# Patient Record
Sex: Female | Born: 1970 | Race: White | Hispanic: No | Marital: Married | Smoking: Never smoker
Health system: Southern US, Community
[De-identification: ages and names within clinical notes are randomized; demographics above are authoritative.]

## PROBLEM LIST (undated history)

## (undated) DIAGNOSIS — R112 Nausea with vomiting, unspecified: Secondary | ICD-10-CM

## (undated) DIAGNOSIS — Z9889 Other specified postprocedural states: Secondary | ICD-10-CM

## (undated) DIAGNOSIS — M1990 Unspecified osteoarthritis, unspecified site: Secondary | ICD-10-CM

## (undated) DIAGNOSIS — R1900 Intra-abdominal and pelvic swelling, mass and lump, unspecified site: Secondary | ICD-10-CM

## (undated) DIAGNOSIS — T4145XA Adverse effect of unspecified anesthetic, initial encounter: Secondary | ICD-10-CM

## (undated) DIAGNOSIS — T8859XA Other complications of anesthesia, initial encounter: Secondary | ICD-10-CM

## (undated) DIAGNOSIS — Z8669 Personal history of other diseases of the nervous system and sense organs: Secondary | ICD-10-CM

## (undated) HISTORY — PX: FOOT NEUROMA SURGERY: SHX646

## (undated) HISTORY — PX: ABDOMINAL HYSTERECTOMY: SHX81

---

## 2000-07-31 ENCOUNTER — Other Ambulatory Visit: Admission: RE | Admit: 2000-07-31 | Discharge: 2000-07-31 | Payer: Self-pay | Admitting: Gynecology

## 2000-10-15 ENCOUNTER — Ambulatory Visit (HOSPITAL_COMMUNITY): Admission: RE | Admit: 2000-10-15 | Discharge: 2000-10-15 | Payer: Self-pay | Admitting: Gastroenterology

## 2001-01-12 ENCOUNTER — Encounter: Payer: Self-pay | Admitting: Gynecology

## 2001-01-12 ENCOUNTER — Encounter: Admission: RE | Admit: 2001-01-12 | Discharge: 2001-01-12 | Payer: Self-pay | Admitting: Gynecology

## 2001-08-26 HISTORY — PX: AUGMENTATION MAMMAPLASTY: SUR837

## 2002-10-01 ENCOUNTER — Other Ambulatory Visit: Admission: RE | Admit: 2002-10-01 | Discharge: 2002-10-01 | Payer: Self-pay | Admitting: Gynecology

## 2004-01-19 ENCOUNTER — Encounter: Admission: RE | Admit: 2004-01-19 | Discharge: 2004-01-19 | Payer: Self-pay | Admitting: Family Medicine

## 2005-05-08 ENCOUNTER — Encounter: Admission: RE | Admit: 2005-05-08 | Discharge: 2005-05-08 | Payer: Self-pay | Admitting: Family Medicine

## 2006-08-13 ENCOUNTER — Encounter: Admission: RE | Admit: 2006-08-13 | Discharge: 2006-08-13 | Payer: Self-pay | Admitting: Family Medicine

## 2008-03-23 ENCOUNTER — Encounter: Admission: RE | Admit: 2008-03-23 | Discharge: 2008-03-23 | Payer: Self-pay | Admitting: Family Medicine

## 2010-09-16 ENCOUNTER — Encounter: Payer: Self-pay | Admitting: Obstetrics and Gynecology

## 2011-01-11 NOTE — Procedures (Signed)
[place]. [facility]  Patient:    Jessica Robinson, Jessica Robinson                    MRN: [id number] Proc. Date: 10/15/00 Adm. Date:  [id number] Attending:  Charna Elizabeth CC:         Rande Brunt. Eda Paschal, M.D.  Tina L. Gatewood, P.A.   Procedure Report  DATE OF BIRTH:  01/30/1971.  PROCEDURE:  Colonoscopy.  ENDOSCOPIST:  Anselmo Rod, M.D.  INSTRUMENT USED:  Olympus video colonoscope.  INDICATION FOR PROCEDURE:  History of endometriosis, severe constipation, blood in stool in 40 year old white female.  Rule out colonic polyps, masses, hemorrhoids, etc.  PREPROCEDURE PREPARATION:  Informed consent was procured from the patient. The patient was fasted for eight hours prior to the procedure and prepped with a bottle of magnesium citrate and a gallon of NuLytely the night prior to the procedure.  PREPROCEDURE PHYSICAL:  VITAL SIGNS:  The patient had stable vital signs.  NECK:  Supple.  CHEST:  Clear to auscultation.  S1, S2 regular.  ABDOMEN:  Soft with normal abdominal bowel sounds.  DESCRIPTION OF PROCEDURE:  The patient was placed in the left lateral decubitus position and sedated with 100 mg of Demerol and 10 mg of Versed intravenously.  Once the patient was adequately sedate and maintained on low-flow oxygen and continuous cardiac monitoring, the Olympus video colonoscope was advanced from the rectum to the cecum and terminal ileum without difficulty.  The entire colonic mucosa appeared healthy with normal vascular pattern, no erosions, ulcerations, masses, or polyps were seen.  The terminal ileum also appeared healthy.  IMPRESSION:  Normal-appearing colon and terminal ileum, small internal hemorrhoids seen on retroflexion.  RECOMMENDATIONS: 1. The patient has been advised to increase the fluid and fiber in her diet. 2. Outpatient follow-up is advised in the next four weeks. DD:  10/15/00 TD:  10/15/00 Job: [id number]

## 2011-06-27 ENCOUNTER — Ambulatory Visit (HOSPITAL_BASED_OUTPATIENT_CLINIC_OR_DEPARTMENT_OTHER)
Admission: RE | Admit: 2011-06-27 | Discharge: 2011-06-28 | Disposition: A | Discharge status: Home / Self Care | Payer: PRIVATE HEALTH INSURANCE | Source: Ambulatory Visit | Attending: Orthopedic Surgery | Admitting: Orthopedic Surgery

## 2011-06-27 DIAGNOSIS — Z5333 Arthroscopic surgical procedure converted to open procedure: Secondary | ICD-10-CM | POA: Insufficient documentation

## 2011-06-27 DIAGNOSIS — M24119 Other articular cartilage disorders, unspecified shoulder: Secondary | ICD-10-CM | POA: Insufficient documentation

## 2011-06-27 DIAGNOSIS — X58XXXA Exposure to other specified factors, initial encounter: Secondary | ICD-10-CM | POA: Insufficient documentation

## 2011-06-27 DIAGNOSIS — S43429A Sprain of unspecified rotator cuff capsule, initial encounter: Secondary | ICD-10-CM | POA: Insufficient documentation

## 2011-06-27 DIAGNOSIS — S43439A Superior glenoid labrum lesion of unspecified shoulder, initial encounter: Secondary | ICD-10-CM | POA: Insufficient documentation

## 2011-06-27 HISTORY — PX: BICEPS TENODESIS: SHX895

## 2011-06-27 HISTORY — PX: SHOULDER ARTHROSCOPY W/ ROTATOR CUFF REPAIR: SHX2400

## 2011-06-29 NOTE — Op Note (Signed)
[name], [name] NO.:  [id number]  MEDICAL RECORD NO.:  [id number]  LOCATION:                                 FACILITY:  PHYSICIAN:  Katy Fitch. Matthieu Loftus, M.D. DATE OF BIRTH:  [birth year]  DATE OF PROCEDURE:  06/27/2011 DATE OF DISCHARGE:                              OPERATIVE REPORT   PREOPERATIVE DIAGNOSES: 1. Type 1 superior labral anterior-posterior lesion MRI documented,     right shoulder. 2. Acromioclavicular arthropathy. 3. Rule out partial-thickness rotator cuff tear.  POSTOPERATIVE DIAGNOSES:  Mild glenohumeral instability with type 1/3 superior labral anterior-posterior lesion with instability of anterior labral origin of anterior-superior glenohumeral ligament and frankly unstable biceps origin and minimal supraspinatus rotator cuff articular surface tendinopathy/degenerative tear.  OPERATION: 1. Examination of right shoulder under anesthesia documenting mild     anterior instability. 2. Diagnostic arthroscopy confirming a type 3 superior labral anterior-     posterior lesion with a degenerative tear of the labrum from 1     o'clock to approximately 3:30 anteriorly with instability at the     anterior superior glenohumeral ligament and labrum. 3. Arthroscopic subacromial decompression with acromioplasty,     coracoacromial ligament release, bursectomy. 4. Arthroscopic distal clavicle resection. 5. Open and arthroscopic subpectoral biceps tenodesis utilizing a 5.5     mm Bio-Corkscrew placed through cortical bone, followed by     arthroscopic resection of biceps stump and debridement of minimal     supraspinatus rotator cuff tear.  OPERATING SURGEON:  Katy Fitch. Emilie Carp, MD  ASSISTANT:  Marveen Reeks Dasnoit, PA-C  ANESTHESIA:  General endotracheal supplemented by a right plexus block.  SUPERVISING ANESTHESIOLOGIST:  Janetta Hora. 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  She presented for evaluation of a painful right shoulder in August 2012. She shared the fact that she had been extremely active throughout her teenage and young adult life; working in a barn with horses for many years, performing heavy lifting; working as an Education officer, community; and now working as a Mining engineer traveling extensively and frequently lifting heavy luggage overhead.  She developed a chronic pain in the right shoulder anterior, superiorly, and posteriorly.  Her pain is aggravated by lifting.  She had night pain.  Her clinical examination suggested internal derangement of the glenohumeral joint and probable impingement and a possible subscapularis rotator cuff tear. She was sent for an MRI of her shoulder at [facility] on May 01, 2011.  Her MRI was interpreted by Dr. Vear Clock, attending radiologist, revealed minimal supraspinatus tendinosis, no MRI findings indicative of a rotator cuff tear, AC arthrosis, which impinged on the subacromial outlet, marrow and juxta-articular soft tissue edema suggesting acute arthropathy of the [facility] joint, and a SLAP lesion.  There was no medial subluxation of the long head of the biceps and no evidence of a subscapularis tear on the MRI.  We did a detailed informed consent with Ms. Base and recommended that when she was done with her summer golf and sports season, it would be appropriate to scope her shoulder and make appropriate repairs.  We told her to anticipate subacromial decompression, possible distal clavicle resection, possible rotator cuff debridement and/or partial repair, and repair of other structures found at the time of surgery.  Given her active lifestyle and the presence of a SLAP, we advised her that we might perform stabilization of her labrum if we found a tear.  Preoperatively, she was advised of the potential risks and benefits of surgery.  The  anticipated benefits would be improvement in her chronic pain, improvement in her lifting capacity, improvement in her sports capacity, and relief of her night discomfort.  Potential complications would include anesthetic complications, neurovascular injury, failure to relieve all her pain, possible stiffness of the shoulder following surgery, possible development of adhesive capsulitis that would require extended therapy, and identification of pathology that we would not be completely able to correct.  Questions were invited and answered in detail.  She elected to proceed with surgery at this time.  PROCEDURE:  Dola Lunsford was brought to room 1 of the [facility] and placed in a supine position upon the operating table. Following a detailed informed consent by Dr. Gelene Mink regarding anesthesia choices, risks and benefits, an ultrasound-guided plexus block was placed without complication.  This led to near-complete anesthesia of the right upper extremity.  In room 1, under Dr. Thornton Dales direct supervision, general endotracheal anesthesia was induced followed by careful positioning in the beach-chair position with the aid of a torso and head holder designed for shoulder arthroscopy.  Examination of the right shoulder under anesthesia in a seated position revealed at 90 degrees of elevation with anterior-posterior stress, some anterior subluxation of the humeral head off of the glenoid.  In external rotation, this was slightly enhanced.  She had no posterior instability or inferior instability and no signs of adhesive capsulitis. The right upper extremity and forequarter were prepped with DuraPrep and draped with impervious arthroscopy drapes.  The procedure commenced with placement of our arthroscope through a standard posterior viewing portal with anterior switching stick technique.  Diagnostic arthroscopy immediately confirmed the presence of a type 3 SLAP lesion with  the biceps displaced laterally and impinging within the joint, an unstable labrum extending anteriorly to approximately 3 o'clock, a peel back of more than 1 cm with frank instability of the biceps origin, and a posterior labral tear extending to approximately 10 o'clock posteriorly.  After photographic documentation of pathology, the remainder of the joint was carefully inspected.  The supraspinatus tendon had a 15% partial-thickness articular side degenerative tear just posterior to the biceps tendon.  The junction of the supraspinatus and infraspinatus tendons was normal.  The teres minor was inspected, the inferior recess inspected, and the inferior and posterior labrum inspected; all found to be normal.  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 The humeral head had excellent hyaline cartilage.  A nerve hook was used to palpate the anterior glenohumeral ligaments and the anterior superior glenohumeral ligaments was frankly unstable.  This was not a Beaufort complex.  The middle and inferior glenohumeral ligaments had a stable origin and the labrum was stable from 4 o'clock inferiorly.  At this point, at age 61 with her heavy lifting demands, we elected to proceed with biceps tenodesis and repair of her labrum and anterior- superior glenohumeral ligament origin.  A second anterior portal was created as a working portal with a clear cannula.  With the aid of a suture lasso, we placed a FiberLoop suture independently through the labrum and anterior-superior glenohumeral ligament, completed  the loop, and subsequently obtained a position at approximately 2 o'clock on the glenoid where we tensioned the anterior- superior glenohumeral ligament and placed a 3.5 mm PushLock with standard technique just at the glenoid rim.  Photographic documentation of the PushLock placement was accomplished with a digital camera and significant tension on the suture revealed that the  PushLock appeared to be stable.  The unstable superior portion of the labrum was then resected arthroscopically with scissors and a suction shaver.  A needle was used to locate the biceps arthroscopically and we considered arthroscopic tenodesis of the biceps.  However, given her lifting demands, etc., I decided to perform traditional open technique through a small muscle-splitting incision.  After locating the biceps and the pectoralis major insertion, a 2.5 cm muscle-splitting incision was fashioned anteriorly, digitally dissected through the deltoid to the bicipital groove, and an Arthrex retractor was placed for visualization.  We identified the biceps tendon, placed an Allis clamp, retracted the tendon superior to the pectoralis major, placed a 6 pass grasping suture in the tendon and subsequently placed at the level of the pectoralis major a 5.5 mm Arthrex BioComposite anchor tensioning the biceps tendon appropriately.  After this was completed and knots buried beneath the pectoralis major tendon, the proximal portion of the tendon was transected with tenotomy scissors and allowed to retract towards the joint.  We subsequently performed closure of this wound with 0-Vicryl subcutaneously and 2-0 Vicryl and intradermal 3-0 Prolene.  The scope was then replaced in the glenohumeral joint followed by use of a Kingfisher to retract the tendon into the working portal through a cannula, followed by use of arthroscopic scissors through the superior portal to transect the biceps.  An arthroscopic shaver was then used to smooth the margins of the resected labrum.  Photographic documentation of the final resection was accomplished with a digital camera.  We then performed a complete bursectomy in the subacromial space, release of coracoacromial ligament, leveled the acromion to a type 1 morphology, and resected the distal centimeter of clavicle.  After hemostasis was achieved, photographic  documentation of decompression was accomplished followed by removal of the arthroscopic equipment.  The portals were repaired with intradermal 3-0 Prolene.  Ms. Nier was placed in a sling, awakened from general anesthesia, and transferred to the recovery room with stable vital signs.  She will be admitted to recovery care center for observation of vital signs and appropriate management of her pain with p.o. and IV Dilaudid as well as oral nonsteroidal medication.  She will be provided Ancef 1 g IV q.8 hours x3 doses as a prophylactic antibiotic.  There were no apparent complications.     Katy Fitch Regina Coppolino, M.D.     RVS/MEDQ  D:  06/27/2011  T:  06/28/2011  Job:  [id number]  cc:   Tarri Fuller, MD  Electronically Signed by Josephine Igo M.D. on 06/29/2011 07:41:10 AM

## 2011-10-29 ENCOUNTER — Other Ambulatory Visit: Payer: Self-pay | Admitting: Family Medicine

## 2011-10-29 DIAGNOSIS — N644 Mastodynia: Secondary | ICD-10-CM

## 2011-11-05 ENCOUNTER — Ambulatory Visit
Admission: RE | Admit: 2011-11-05 | Discharge: 2011-11-05 | Disposition: A | Discharge status: Home / Self Care | Payer: PRIVATE HEALTH INSURANCE | Source: Ambulatory Visit | Attending: Family Medicine | Admitting: Family Medicine

## 2011-11-05 ENCOUNTER — Other Ambulatory Visit: Payer: Self-pay | Admitting: Family Medicine

## 2011-11-05 DIAGNOSIS — N644 Mastodynia: Secondary | ICD-10-CM

## 2011-11-05 DIAGNOSIS — N63 Unspecified lump in unspecified breast: Secondary | ICD-10-CM

## 2012-10-01 ENCOUNTER — Other Ambulatory Visit: Payer: Self-pay | Admitting: Gastroenterology

## 2012-10-01 DIAGNOSIS — R110 Nausea: Secondary | ICD-10-CM

## 2012-10-01 DIAGNOSIS — R1011 Right upper quadrant pain: Secondary | ICD-10-CM

## 2012-10-06 ENCOUNTER — Ambulatory Visit
Admission: RE | Admit: 2012-10-06 | Discharge: 2012-10-06 | Disposition: A | Discharge status: Home / Self Care | Payer: PRIVATE HEALTH INSURANCE | Source: Ambulatory Visit | Attending: Gastroenterology | Admitting: Gastroenterology

## 2012-10-06 DIAGNOSIS — R1011 Right upper quadrant pain: Secondary | ICD-10-CM

## 2012-10-06 DIAGNOSIS — R110 Nausea: Secondary | ICD-10-CM

## 2012-10-09 ENCOUNTER — Ambulatory Visit (HOSPITAL_COMMUNITY): Payer: PRIVATE HEALTH INSURANCE

## 2012-10-12 ENCOUNTER — Ambulatory Visit (HOSPITAL_COMMUNITY): Payer: PRIVATE HEALTH INSURANCE

## 2012-10-12 ENCOUNTER — Other Ambulatory Visit (HOSPITAL_COMMUNITY): Payer: PRIVATE HEALTH INSURANCE

## 2012-10-12 ENCOUNTER — Encounter (HOSPITAL_COMMUNITY): Payer: PRIVATE HEALTH INSURANCE

## 2012-10-16 ENCOUNTER — Ambulatory Visit (HOSPITAL_COMMUNITY): Payer: PRIVATE HEALTH INSURANCE

## 2012-10-16 ENCOUNTER — Encounter (HOSPITAL_COMMUNITY): Payer: PRIVATE HEALTH INSURANCE

## 2012-10-16 ENCOUNTER — Encounter (HOSPITAL_COMMUNITY)
Admission: RE | Admit: 2012-10-16 | Discharge: 2012-10-16 | Disposition: A | Discharge status: Home / Self Care | Payer: PRIVATE HEALTH INSURANCE | Source: Ambulatory Visit | Attending: Gastroenterology | Admitting: Gastroenterology

## 2012-10-16 DIAGNOSIS — R109 Unspecified abdominal pain: Secondary | ICD-10-CM | POA: Insufficient documentation

## 2012-10-16 DIAGNOSIS — R1011 Right upper quadrant pain: Secondary | ICD-10-CM

## 2012-10-16 DIAGNOSIS — K9089 Other intestinal malabsorption: Secondary | ICD-10-CM | POA: Insufficient documentation

## 2012-10-16 DIAGNOSIS — R110 Nausea: Secondary | ICD-10-CM | POA: Insufficient documentation

## 2012-10-16 MED ORDER — TECHNETIUM TC 99M MEBROFENIN IV KIT
5.0000 | PACK | Freq: Once | INTRAVENOUS | Status: AC | PRN
  Administered 2012-10-16: 5 via INTRAVENOUS

## 2012-10-16 MED ORDER — SINCALIDE 5 MCG IJ SOLR
0.0200 ug/kg | Freq: Once | INTRAMUSCULAR | Status: AC
  Administered 2012-10-16: 1.4 ug via INTRAVENOUS

## 2012-10-16 MED ORDER — SINCALIDE 5 MCG IJ SOLR
INTRAMUSCULAR | Status: AC
  Administered 2012-10-16: 1.4 ug via INTRAVENOUS
  Filled 2012-10-16: qty 5

## 2012-10-22 ENCOUNTER — Encounter (INDEPENDENT_AMBULATORY_CARE_PROVIDER_SITE_OTHER): Payer: Self-pay | Admitting: Surgery

## 2012-10-27 ENCOUNTER — Encounter (INDEPENDENT_AMBULATORY_CARE_PROVIDER_SITE_OTHER): Payer: Self-pay | Admitting: Surgery

## 2012-10-27 ENCOUNTER — Ambulatory Visit (INDEPENDENT_AMBULATORY_CARE_PROVIDER_SITE_OTHER): Payer: PRIVATE HEALTH INSURANCE | Admitting: Surgery

## 2012-10-27 VITALS — BP 118/70 | HR 72 | Temp 97.3°F | Resp 16 | Ht 63.5 in | Wt 166.0 lb

## 2012-10-27 DIAGNOSIS — K811 Chronic cholecystitis: Secondary | ICD-10-CM

## 2012-10-27 HISTORY — DX: Chronic cholecystitis: K81.1

## 2012-10-27 NOTE — Progress Notes (Signed)
Patient ID: Jessica Robinson, female   DOB: Jun 11, 1971, 42 y.o.   MRN: [id number]  Chief Complaint  Patient presents with  . New Evaluation    eval GB    HPI Jessica Robinson is a 42 y.o. female.   HPI This is a very pleasant female referred by Dr. Loreta Ave for evaluation of epigastric and right upper quadrant abdominal pain. She has had nausea with this as well. She has a history of colitis and had a severe attack  While on vacation. Since then, she has had nausea in the morning. She also has right quadrant abdominal pain.  She has not had a change in bowel movements. This appears to be worse with fatty meals. She has had an extensive workup for the discomfort. She is otherwise without complaints. Past Medical History  Diagnosis Date  . Endometriosis of ovary   . 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 Diverticular disease   . Colitis     Past Surgical History  Procedure Laterality Date  . Abdominal hysterectomy    . Foot neuroma surgery    . Rotator cuff repair    . Lumbar fusion    . Biceps tendon repair      Family History  Problem Relation Age of Onset  . Cancer Father     waldenstroms  . Cancer Sister     Raj Janus  . Cancer Paternal Aunt     breast  . Cancer Paternal Grandmother     breat    Social History History  Substance Use Topics  . Smoking status: Never Smoker   . Smokeless tobacco: Never Used  . Alcohol Use: Yes     Comment: occ    Allergies  Allergen Reactions  . Macrolides And Ketolides   . Phenergan (Promethazine Hcl)     Current Outpatient Prescriptions  Medication Sig Dispense Refill  . Estrogens Conjugated (PREMARIN PO) Take by mouth.       No current facility-administered medications for this visit.    Review of Systems Review of Systems  Constitutional: Positive for appetite change. Negative for fever, chills and unexpected weight change.  HENT: Negative for hearing loss, congestion, sore throat, trouble swallowing and voice change.   Eyes:  Negative for visual disturbance.  Respiratory: Negative for cough and wheezing.   Cardiovascular: Negative for chest pain, palpitations and leg swelling.  Gastrointestinal: Positive for nausea and abdominal pain. Negative for vomiting, diarrhea, constipation, blood in stool, abdominal distention and anal bleeding.  Genitourinary: Negative for hematuria, vaginal bleeding and difficulty urinating.  Musculoskeletal: Negative for arthralgias.  Skin: Negative for rash and wound.  Neurological: Negative for seizures, syncope and headaches.  Hematological: Negative for adenopathy. Does not bruise/bleed easily.  Psychiatric/Behavioral: Negative for confusion.    Blood pressure 118/70, pulse 72, temperature 97.3 F (36.3 C), temperature source Temporal, resp. rate 16, height 5' 3.5" (1.613 m), weight 166 lb (75.297 kg).  Physical Exam Physical Exam  Constitutional: She is oriented to person, place, and time. She appears well-developed and well-nourished. No distress.  HENT:  Head: Normocephalic and atraumatic.  Right Ear: External ear normal.  Left Ear: External ear normal.  Nose: Nose normal.  Mouth/Throat: Oropharynx is clear and moist. No oropharyngeal exudate.  Eyes: Conjunctivae are normal. Pupils are equal, round, and reactive to light. Right eye exhibits no discharge. Left eye exhibits no discharge. No scleral icterus.  Neck: Normal range of motion. Neck supple. No tracheal deviation present. No thyromegaly present.  Cardiovascular: Normal rate,  regular rhythm, normal heart sounds and intact distal pulses.   No murmur heard. Pulmonary/Chest: Effort normal and breath sounds normal. No respiratory distress. She has no wheezes. She has no rales.  Abdominal: Soft. Bowel sounds are normal. She exhibits no distension. There is tenderness. There is no rebound.  There is very mild tenderness with mild guarding in the right upper quadrant  Musculoskeletal: Normal range of motion. She exhibits no  edema and no tenderness.  Lymphadenopathy:    She has no cervical adenopathy.  Neurological: She is alert and oriented to person, place, and time. No cranial nerve deficit. Coordination normal.  Skin: Skin is warm and dry. No rash noted. She is not diaphoretic. No erythema.  Psychiatric: Her behavior is normal. Judgment normal.    Data Reviewed I have reviewed her x-ray data including her ultrasound and high scan as well as her endoscopy report.  Assessment    Biliary colic and suspected chronic cholecystitis     Plan    Given the fact that she had symptoms with CCK administration and has a history of colitis as well as physical examination, I do believe she may have chronic cholecystitis. I discussed expected management versus going ahead and proceeding with a laparoscopic cholecystectomy. I gave her literature regarding surgery. I discussed the risks which includes but is not limited to bleeding, infection, bile duct injury, bile leak, injury to other structures, the need to convert to an open procedure, and the chance this may not resolve her symptoms. She understands and wishes to proceed. Surgery will be scheduled. Likelihood of success is moderate        BLACKMAN,DOUGLAS A 10/27/2012, 12:12 PM

## 2012-11-10 ENCOUNTER — Encounter (HOSPITAL_COMMUNITY): Payer: Self-pay

## 2012-11-11 ENCOUNTER — Encounter (HOSPITAL_COMMUNITY): Payer: Self-pay

## 2012-11-11 ENCOUNTER — Encounter (HOSPITAL_COMMUNITY)
Admission: RE | Admit: 2012-11-11 | Discharge: 2012-11-11 | Disposition: A | Discharge status: Home / Self Care | Payer: PRIVATE HEALTH INSURANCE | Source: Ambulatory Visit | Attending: Surgery | Admitting: Surgery

## 2012-11-11 HISTORY — DX: Other complications of anesthesia, initial encounter: T88.59XA

## 2012-11-11 HISTORY — DX: Adverse effect of unspecified anesthetic, initial encounter: T41.45XA

## 2012-11-11 LAB — CBC
Hemoglobin: 14.5 g/dL (ref 12.0–15.0)
MCH: 29.8 pg (ref 26.0–34.0)
MCV: 86.4 fL (ref 78.0–100.0)
RBC: 4.86 MIL/uL (ref 3.87–5.11)

## 2012-11-11 NOTE — Pre-Procedure Instructions (Signed)
Jessica Robinson  11/11/2012   Your procedure is scheduled on:  Friday, March 28th.   Report to [facility] at 5:30 AM.  Call this number if you have problems the morning of surgery: [phone]   Remember:   Do not eat food or drink liquids after midnight.   Take these medicines the morning of surgery with A SIP OF WATER: Estrogens (Premarin).  Ondanstetron (Zofran) if needed.   Do not wear jewelry, make-up or nail polish.  Do not wear lotions, powders, or perfumes. You may wear deodorant.  Do not shave 48 hours prior to surgery. Men may shave face and neck.  Do not bring valuables to the hospital.  Contacts, dentures or bridgework may not be worn into surgery.  Leave suitcase in the car. After surgery it may be brought to your room.  For patients admitted to the hospital, checkout time is 11:00 AM the day of  discharge.   Patients discharged the day of surgery will not be allowed to drive  home.  Name and phone number of your driver: -   Special Instructions: Shower using CHG 2 nights before surgery and the night before surgery.  If you shower the day of surgery use CHG.  Use special wash - you have one bottle of CHG for all showers.  You should use approximately 1/3 of the bottle for each shower.   Please read over the following fact sheets that you were given: Pain Booklet, Coughing and Deep Breathing and Surgical Site Infection Prevention

## 2012-11-17 NOTE — H&P (Signed)
Patient ID: Jessica Robinson, female DOB: [birth year], 42 y.o. MRN: [id number]  Chief Complaint   Patient presents with   .  New Evaluation     eval GB   HPI  Jessica Robinson is a 42 y.o. female.  HPI  This is a very pleasant female referred by Dr. Loreta Ave for evaluation of epigastric and right upper quadrant abdominal pain. She has had nausea with this as well. She has a history of colitis and had a severe attack While on vacation. Since then, she has had nausea in the morning. She also has right quadrant abdominal pain. She has not had a change in bowel movements. This appears to be worse with fatty meals. She has had an extensive workup for the discomfort. She is otherwise without complaints.  Past Medical History   Diagnosis  Date   .  Endometriosis of ovary    .  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  Diverticular disease    .  Colitis     Past Surgical History   Procedure  Laterality  Date   .  Abdominal hysterectomy     .  Foot neuroma surgery     .  Rotator cuff repair     .  Lumbar fusion     .  Biceps tendon repair      Family History   Problem  Relation  Age of Onset   .  Cancer  Father      waldenstroms   .  Cancer  Sister      Raj Janus   .  Cancer  Paternal Aunt      breast   .  Cancer  Paternal Grandmother      breat   Social History  History   Substance Use Topics   .  Smoking status:  Never Smoker   .  Smokeless tobacco:  Never Used   .  Alcohol Use:  Yes      Comment: occ    Allergies   Allergen  Reactions   .  Macrolides And Ketolides    .  Phenergan (Promethazine Hcl)     Current Outpatient Prescriptions   Medication  Sig  Dispense  Refill   .  Estrogens Conjugated (PREMARIN PO)  Take by mouth.      No current facility-administered medications for this visit.   Review of Systems  Review of Systems  Constitutional: Positive for appetite change. Negative for fever, chills and unexpected weight change.  HENT: Negative for hearing loss, congestion, sore  throat, trouble swallowing and voice change.  Eyes: Negative for visual disturbance.  Respiratory: Negative for cough and wheezing.  Cardiovascular: Negative for chest pain, palpitations and leg swelling.  Gastrointestinal: Positive for nausea and abdominal pain. Negative for vomiting, diarrhea, constipation, blood in stool, abdominal distention and anal bleeding.  Genitourinary: Negative for hematuria, vaginal bleeding and difficulty urinating.  Musculoskeletal: Negative for arthralgias.  Skin: Negative for rash and wound.  Neurological: Negative for seizures, syncope and headaches.  Hematological: Negative for adenopathy. Does not bruise/bleed easily.  Psychiatric/Behavioral: Negative for confusion.  Blood pressure 118/70, pulse 72, temperature 97.3 F (36.3 C), temperature source Temporal, resp. rate 16, height 5' 3.5" (1.613 m), weight 166 lb (75.297 kg).  Physical Exam  Physical Exam  Constitutional: She is oriented to person, place, and time. She appears well-developed and well-nourished. No distress.  HENT:  Head: Normocephalic and atraumatic.  Right Ear: External ear normal.  Left Ear: External ear  normal.  Nose: Nose normal.  Mouth/Throat: Oropharynx is clear and moist. No oropharyngeal exudate.  Eyes: Conjunctivae are normal. Pupils are equal, round, and reactive to light. Right eye exhibits no discharge. Left eye exhibits no discharge. No scleral icterus.  Neck: Normal range of motion. Neck supple. No tracheal deviation present. No thyromegaly present.  Cardiovascular: Normal rate, regular rhythm, normal heart sounds and intact distal pulses.  No murmur heard.  Pulmonary/Chest: Effort normal and breath sounds normal. No respiratory distress. She has no wheezes. She has no rales.  Abdominal: Soft. Bowel sounds are normal. She exhibits no distension. There is tenderness. There is no rebound.  There is very mild tenderness with mild guarding in the right upper quadrant   Musculoskeletal: Normal range of motion. She exhibits no edema and no tenderness.  Lymphadenopathy:  She has no cervical adenopathy.  Neurological: She is alert and oriented to person, place, and time. No cranial nerve deficit. Coordination normal.  Skin: Skin is warm and dry. No rash noted. She is not diaphoretic. No erythema.  Psychiatric: Her behavior is normal. Judgment normal.  Data Reviewed  I have reviewed her x-ray data including her ultrasound and high scan as well as her endoscopy report.  Assessment  Biliary colic and suspected chronic cholecystitis  Plan  Given the fact that she had symptoms with CCK administration and has a history of colitis as well as physical examination, I do believe she may have chronic cholecystitis. I discussed expected management versus going ahead and proceeding with a laparoscopic cholecystectomy. I gave her literature regarding surgery. I discussed the risks which includes but is not limited to bleeding, infection, bile duct injury, bile leak, injury to other structures, the need to convert to an open procedure, and the chance this may not resolve her symptoms. She understands and wishes to proceed. Surgery will be scheduled. Likelihood of success is moderate

## 2012-11-19 MED ORDER — CEFAZOLIN SODIUM-DEXTROSE 2-3 GM-% IV SOLR
2.0000 g | INTRAVENOUS | Status: AC
  Administered 2012-11-20: 2 g via INTRAVENOUS
  Filled 2012-11-19: qty 50

## 2012-11-20 ENCOUNTER — Encounter (HOSPITAL_COMMUNITY)
Admission: RE | Disposition: A | Discharge status: Home / Self Care | Payer: Self-pay | Source: Ambulatory Visit | Attending: Surgery

## 2012-11-20 ENCOUNTER — Ambulatory Visit (HOSPITAL_COMMUNITY): Payer: PRIVATE HEALTH INSURANCE | Admitting: Certified Registered Nurse Anesthetist

## 2012-11-20 ENCOUNTER — Encounter (HOSPITAL_COMMUNITY): Payer: Self-pay | Admitting: Certified Registered Nurse Anesthetist

## 2012-11-20 ENCOUNTER — Ambulatory Visit (HOSPITAL_COMMUNITY)
Admission: RE | Admit: 2012-11-20 | Discharge: 2012-11-20 | Disposition: A | Discharge status: Home / Self Care | Payer: PRIVATE HEALTH INSURANCE | Source: Ambulatory Visit | Attending: Surgery | Admitting: Surgery

## 2012-11-20 DIAGNOSIS — Z01812 Encounter for preprocedural laboratory examination: Secondary | ICD-10-CM | POA: Insufficient documentation

## 2012-11-20 DIAGNOSIS — K811 Chronic cholecystitis: Secondary | ICD-10-CM | POA: Insufficient documentation

## 2012-11-20 DIAGNOSIS — K8010 Calculus of gallbladder with chronic cholecystitis without obstruction: Secondary | ICD-10-CM

## 2012-11-20 HISTORY — PX: CHOLECYSTECTOMY: SHX55

## 2012-11-20 SURGERY — LAPAROSCOPIC CHOLECYSTECTOMY
Anesthesia: General | Site: Abdomen | Wound class: Clean Contaminated

## 2012-11-20 MED ORDER — ONDANSETRON HCL 4 MG/2ML IJ SOLN
4.0000 mg | Freq: Four times a day (QID) | INTRAMUSCULAR | Status: DC | PRN
  Filled 2012-11-20: qty 2

## 2012-11-20 MED ORDER — HYDROMORPHONE HCL PF 1 MG/ML IJ SOLN
INTRAMUSCULAR | Status: AC
  Filled 2012-11-20: qty 1

## 2012-11-20 MED ORDER — 0.9 % SODIUM CHLORIDE (POUR BTL) OPTIME
TOPICAL | Status: DC | PRN
  Administered 2012-11-20: 1000 mL

## 2012-11-20 MED ORDER — ACETAMINOPHEN 650 MG RE SUPP
650.0000 mg | RECTAL | Status: DC | PRN
  Filled 2012-11-20: qty 1

## 2012-11-20 MED ORDER — OXYCODONE HCL 5 MG PO TABS: 5.0000 mg | ORAL_TABLET | Freq: Once | ORAL | Status: DC | PRN

## 2012-11-20 MED ORDER — HYDROMORPHONE HCL PF 1 MG/ML IJ SOLN
0.2500 mg | INTRAMUSCULAR | Status: DC | PRN
  Administered 2012-11-20 (×2): 0.25 mg via INTRAVENOUS
  Administered 2012-11-20: 0.5 mg via INTRAVENOUS
  Administered 2012-11-20: 0.25 mg via INTRAVENOUS

## 2012-11-20 MED ORDER — SODIUM CHLORIDE 0.9 % IV SOLN: 250.0000 mL | INTRAVENOUS | Status: DC | PRN

## 2012-11-20 MED ORDER — HYDROCODONE-ACETAMINOPHEN 5-325 MG PO TABS: 1.0000 | ORAL_TABLET | ORAL | Status: DC | PRN

## 2012-11-20 MED ORDER — SODIUM CHLORIDE 0.9 % IJ SOLN: 3.0000 mL | Freq: Two times a day (BID) | INTRAMUSCULAR | Status: DC

## 2012-11-20 MED ORDER — LIDOCAINE HCL 4 % MT SOLN
OROMUCOSAL | Status: DC | PRN
  Administered 2012-11-20: 4 mL via TOPICAL

## 2012-11-20 MED ORDER — MORPHINE SULFATE 4 MG/ML IJ SOLN: 4.0000 mg | INTRAMUSCULAR | Status: DC | PRN

## 2012-11-20 MED ORDER — LACTATED RINGERS IV SOLN
INTRAVENOUS | Status: DC | PRN
  Administered 2012-11-20: 07:00:00 via INTRAVENOUS

## 2012-11-20 MED ORDER — LIDOCAINE HCL (CARDIAC) 20 MG/ML IV SOLN
INTRAVENOUS | Status: DC | PRN
  Administered 2012-11-20: 30 mg via INTRAVENOUS
  Administered 2012-11-20: 70 mg via INTRAVENOUS

## 2012-11-20 MED ORDER — ACETAMINOPHEN 325 MG PO TABS
650.0000 mg | ORAL_TABLET | ORAL | Status: DC | PRN
  Filled 2012-11-20: qty 2

## 2012-11-20 MED ORDER — MIDAZOLAM HCL 5 MG/5ML IJ SOLN
INTRAMUSCULAR | Status: DC | PRN
  Administered 2012-11-20: 2 mg via INTRAVENOUS

## 2012-11-20 MED ORDER — PROPOFOL 10 MG/ML IV BOLUS
INTRAVENOUS | Status: DC | PRN
  Administered 2012-11-20: 170 mg via INTRAVENOUS

## 2012-11-20 MED ORDER — ONDANSETRON HCL 4 MG/2ML IJ SOLN
INTRAMUSCULAR | Status: DC | PRN
  Administered 2012-11-20: 4 mg via INTRAVENOUS

## 2012-11-20 MED ORDER — GLYCOPYRROLATE 0.2 MG/ML IJ SOLN
INTRAMUSCULAR | Status: DC | PRN
  Administered 2012-11-20 (×2): 0.1 mg via INTRAVENOUS
  Administered 2012-11-20: .8 mg via INTRAVENOUS

## 2012-11-20 MED ORDER — NEOSTIGMINE METHYLSULFATE 1 MG/ML IJ SOLN
INTRAMUSCULAR | Status: DC | PRN
  Administered 2012-11-20: 5 mg via INTRAVENOUS

## 2012-11-20 MED ORDER — SODIUM CHLORIDE 0.9 % IJ SOLN: 3.0000 mL | INTRAMUSCULAR | Status: DC | PRN

## 2012-11-20 MED ORDER — BUPIVACAINE-EPINEPHRINE 0.25% -1:200000 IJ SOLN
INTRAMUSCULAR | Status: DC | PRN
  Administered 2012-11-20: 20 mL

## 2012-11-20 MED ORDER — BUPIVACAINE-EPINEPHRINE 0.25% -1:200000 IJ SOLN
INTRAMUSCULAR | Status: AC
  Filled 2012-11-20: qty 1

## 2012-11-20 MED ORDER — FENTANYL CITRATE 0.05 MG/ML IJ SOLN
INTRAMUSCULAR | Status: DC | PRN
  Administered 2012-11-20 (×2): 100 ug via INTRAVENOUS
  Administered 2012-11-20: 50 ug via INTRAVENOUS

## 2012-11-20 MED ORDER — PROMETHAZINE HCL 25 MG/ML IJ SOLN: 6.2500 mg | INTRAMUSCULAR | Status: DC | PRN

## 2012-11-20 MED ORDER — BUPIVACAINE-EPINEPHRINE PF 0.25-1:200000 % IJ SOLN
INTRAMUSCULAR | Status: AC
  Filled 2012-11-20: qty 30

## 2012-11-20 MED ORDER — KETOROLAC TROMETHAMINE 30 MG/ML IJ SOLN
INTRAMUSCULAR | Status: DC | PRN
  Administered 2012-11-20: 30 mg via INTRAVENOUS

## 2012-11-20 MED ORDER — SODIUM CHLORIDE 0.9 % IR SOLN
Status: DC | PRN
  Administered 2012-11-20: 1000 mL

## 2012-11-20 MED ORDER — ONDANSETRON HCL 8 MG PO TABS: 8.0000 mg | ORAL_TABLET | Freq: Two times a day (BID) | ORAL | Status: DC | PRN

## 2012-11-20 MED ORDER — DEXAMETHASONE SODIUM PHOSPHATE 4 MG/ML IJ SOLN
INTRAMUSCULAR | Status: DC | PRN
  Administered 2012-11-20: 8 mg via INTRAVENOUS

## 2012-11-20 MED ORDER — ROCURONIUM BROMIDE 100 MG/10ML IV SOLN
INTRAVENOUS | Status: DC | PRN
  Administered 2012-11-20: 50 mg via INTRAVENOUS

## 2012-11-20 MED ORDER — OXYCODONE HCL 5 MG PO TABS: 5.0000 mg | ORAL_TABLET | ORAL | Status: DC | PRN

## 2012-11-20 MED ORDER — OXYCODONE HCL 5 MG/5ML PO SOLN: 5.0000 mg | Freq: Once | ORAL | Status: DC | PRN

## 2012-11-20 MED ORDER — MEPERIDINE HCL 25 MG/ML IJ SOLN: 6.2500 mg | INTRAMUSCULAR | Status: DC | PRN

## 2012-11-20 SURGICAL SUPPLY — 33 items
APL SKNCLS STERI-STRIP NONHPOA (GAUZE/BANDAGES/DRESSINGS) ×1
APPLIER CLIP 5 13 M/L LIGAMAX5 (MISCELLANEOUS) ×2
APR CLP MED LRG 5 ANG JAW (MISCELLANEOUS) ×1
BANDAGE ADHESIVE 1X3 (GAUZE/BANDAGES/DRESSINGS) ×5 IMPLANT
BENZOIN TINCTURE PRP APPL 2/3 (GAUZE/BANDAGES/DRESSINGS) ×2 IMPLANT
CANISTER SUCTION 2500CC (MISCELLANEOUS) ×2 IMPLANT
CHLORAPREP W/TINT 26ML (MISCELLANEOUS) ×2 IMPLANT
CLIP APPLIE 5 13 M/L LIGAMAX5 (MISCELLANEOUS) ×1 IMPLANT
CLOTH BEACON ORANGE TIMEOUT ST (SAFETY) ×2 IMPLANT
CLSR STERI-STRIP ANTIMIC 1/2X4 (GAUZE/BANDAGES/DRESSINGS) ×1 IMPLANT
COVER SURGICAL LIGHT HANDLE (MISCELLANEOUS) ×2 IMPLANT
DECANTER SPIKE VIAL GLASS SM (MISCELLANEOUS) ×2 IMPLANT
ELECT REM PT RETURN 9FT ADLT (ELECTROSURGICAL) ×2
ELECTRODE REM PT RTRN 9FT ADLT (ELECTROSURGICAL) ×1 IMPLANT
GLOVE BIOGEL PI IND STRL 7.0 (GLOVE) IMPLANT
GLOVE BIOGEL PI INDICATOR 7.0 (GLOVE) ×1
GLOVE SURG SIGNA 7.5 PF LTX (GLOVE) ×2 IMPLANT
GLOVE SURG SS PI 7.0 STRL IVOR (GLOVE) ×1 IMPLANT
GOWN PREVENTION PLUS XLARGE (GOWN DISPOSABLE) ×2 IMPLANT
GOWN STRL NON-REIN LRG LVL3 (GOWN DISPOSABLE) ×6 IMPLANT
KIT BASIN OR (CUSTOM PROCEDURE TRAY) ×2 IMPLANT
KIT ROOM TURNOVER OR (KITS) ×2 IMPLANT
NS IRRIG 1000ML POUR BTL (IV SOLUTION) ×2 IMPLANT
PAD ARMBOARD 7.5X6 YLW CONV (MISCELLANEOUS) ×4 IMPLANT
SET IRRIG TUBING LAPAROSCOPIC (IRRIGATION / IRRIGATOR) ×2 IMPLANT
SLEEVE ENDOPATH XCEL 5M (ENDOMECHANICALS) ×4 IMPLANT
SPECIMEN JAR SMALL (MISCELLANEOUS) ×2 IMPLANT
SUT MON AB 4-0 PC3 18 (SUTURE) ×2 IMPLANT
TOWEL OR 17X24 6PK STRL BLUE (TOWEL DISPOSABLE) ×2 IMPLANT
TOWEL OR 17X26 10 PK STRL BLUE (TOWEL DISPOSABLE) ×2 IMPLANT
TRAY LAPAROSCOPIC (CUSTOM PROCEDURE TRAY) ×2 IMPLANT
TROCAR XCEL BLUNT TIP 100MML (ENDOMECHANICALS) ×2 IMPLANT
TROCAR XCEL NON-BLD 5MMX100MML (ENDOMECHANICALS) ×2 IMPLANT

## 2012-11-20 NOTE — Anesthesia Procedure Notes (Signed)
Procedure Name: Intubation Date/Time: 11/20/2012 7:25 AM Performed by: Margaree Mackintosh Pre-anesthesia Checklist: Patient identified, Timeout performed, Emergency Drugs available, Suction available and Patient being monitored Patient Re-evaluated:Patient Re-evaluated prior to inductionOxygen Delivery Method: Circle system utilized Preoxygenation: Pre-oxygenation with 100% oxygen Intubation Type: IV induction Ventilation: Mask ventilation without difficulty Laryngoscope Size: Mac and 3 Grade View: Grade I Tube type: Oral Tube size: 7.0 mm Number of attempts: 1 Airway Equipment and Method: Stylet and LTA kit utilized Placement Confirmation: ETT inserted through vocal cords under direct vision,  positive ETCO2 and breath sounds checked- equal and bilateral Secured at: 20 cm Tube secured with: Tape Dental Injury: Teeth and Oropharynx as per pre-operative assessment

## 2012-11-20 NOTE — Anesthesia Preprocedure Evaluation (Addendum)
Anesthesia Evaluation  Patient identified by MRN, date of birth, ID band  Reviewed: Allergy & Precautions, H&P , NPO status , Patient's Chart, lab work & pertinent test results  Airway Mallampati: I  Neck ROM: Full    Dental  (+) Teeth Intact   Pulmonary          Cardiovascular negative cardio ROS  Rhythm:Regular Rate:Normal     Neuro/Psych  Headaches,    GI/Hepatic Neg liver ROS, GERD-  ,  Endo/Other  negative endocrine ROS  Renal/GU negative Renal ROS     Musculoskeletal negative musculoskeletal ROS (+)   Abdominal   Peds  Hematology negative hematology ROS (+)   Anesthesia Other Findings   Reproductive/Obstetrics negative OB ROS                          Anesthesia Physical Anesthesia Plan  ASA: I  Anesthesia Plan: General   Post-op Pain Management:    Induction: Intravenous  Airway Management Planned: Oral ETT  Additional Equipment:   Intra-op Plan:   Post-operative Plan: Extubation in OR  Informed Consent: I have reviewed the patients History and Physical, chart, labs and discussed the procedure including the risks, benefits and alternatives for the proposed anesthesia with the patient or authorized representative who has indicated his/her understanding and acceptance.   Dental advisory given  Plan Discussed with: CRNA and Surgeon  Anesthesia Plan Comments:         Anesthesia Quick Evaluation

## 2012-11-20 NOTE — Op Note (Signed)

## 2012-11-20 NOTE — Transfer of Care (Signed)
Immediate Anesthesia Transfer of Care Note  Patient: Jessica Robinson  Procedure(s) Performed: Procedure(s): LAPAROSCOPIC CHOLECYSTECTOMY possible IOC  (N/A)  Patient Location: PACU  Anesthesia Type:General  Level of Consciousness: awake, alert  and oriented  Airway & Oxygen Therapy: Patient Spontanous Breathing and Patient connected to nasal cannula oxygen  Post-op Assessment: Report given to PACU RN and Post -op Vital signs reviewed and stable  Post vital signs: Reviewed and stable  Complications: No apparent anesthesia complications

## 2012-11-20 NOTE — Progress Notes (Signed)
Husband called to bedside.

## 2012-11-20 NOTE — Progress Notes (Signed)
Dr Boneta Lucks at bedside and aware of Nausea.   States to keep pt in pacu a little longer.  No  further antiemetics orders received

## 2012-11-20 NOTE — Interval H&P Note (Signed)
History and Physical Interval Note: no change in H and P  11/20/2012 6:57 AM  Jessica Robinson  has presented today for surgery, with the diagnosis of GALLBLADDER DISEASE   The various methods of treatment have been discussed with the patient and family. After consideration of risks, benefits and other options for treatment, the patient has consented to  Procedure(s): LAPAROSCOPIC CHOLECYSTECTOMY possible IOC  (N/A) as a surgical intervention .  The patient's history has been reviewed, patient examined, no change in status, stable for surgery.  I have reviewed the patient's chart and labs.  Questions were answered to the patient's satisfaction.     [name]

## 2012-11-20 NOTE — Progress Notes (Signed)
Attempt made to gt pt oob to BR. She became nauseated with movement, and vomited in WC.  Pt back to bed and placed on monitors.

## 2012-11-20 NOTE — Preoperative (Signed)
Beta Blockers   Reason not to administer Beta Blockers:Not Applicable 

## 2012-11-20 NOTE — Progress Notes (Signed)
Pt states while there is a little residual nausea, she feels much better and is ready to go home. She has ambulated to BR and voided without difficulty, and is tolerating PO's .  VSS,  D/c insurctions given.

## 2012-11-20 NOTE — Anesthesia Postprocedure Evaluation (Signed)
  Anesthesia Post-op Note  Patient: Jessica Robinson  Procedure(s) Performed: Procedure(s): LAPAROSCOPIC CHOLECYSTECTOMY possible IOC  (N/A)  Patient Location: PACU  Anesthesia Type:General  Level of Consciousness: awake and oriented  Airway and Oxygen Therapy: Patient Spontanous Breathing  Post-op Pain: mild  Post-op Assessment: Post-op Vital signs reviewed and NAUSEA AND VOMITING PRESENT  Post-op Vital Signs: stable  Complications: No apparent anesthesia complications

## 2012-11-23 ENCOUNTER — Encounter (HOSPITAL_COMMUNITY): Payer: Self-pay | Admitting: Surgery

## 2012-11-23 ENCOUNTER — Telehealth (INDEPENDENT_AMBULATORY_CARE_PROVIDER_SITE_OTHER): Payer: Self-pay | Admitting: General Surgery

## 2012-11-23 NOTE — Telephone Encounter (Signed)
LMOM for patient to call back and ask for [facility] for a follow up apt with Dr Magnus Ivan

## 2012-11-25 ENCOUNTER — Encounter (INDEPENDENT_AMBULATORY_CARE_PROVIDER_SITE_OTHER): Payer: Self-pay | Admitting: Surgery

## 2012-11-25 ENCOUNTER — Other Ambulatory Visit (INDEPENDENT_AMBULATORY_CARE_PROVIDER_SITE_OTHER): Payer: Self-pay | Admitting: Surgery

## 2012-11-25 ENCOUNTER — Telehealth (INDEPENDENT_AMBULATORY_CARE_PROVIDER_SITE_OTHER): Payer: Self-pay | Admitting: *Deleted

## 2012-11-25 ENCOUNTER — Ambulatory Visit (INDEPENDENT_AMBULATORY_CARE_PROVIDER_SITE_OTHER): Payer: PRIVATE HEALTH INSURANCE | Admitting: Surgery

## 2012-11-25 ENCOUNTER — Inpatient Hospital Stay (HOSPITAL_COMMUNITY)
Admission: EM | Admit: 2012-11-25 | Discharge: 2012-11-28 | DRG: 948 | Disposition: A | Discharge status: Home / Self Care | Payer: PRIVATE HEALTH INSURANCE | Attending: Surgery | Admitting: Surgery

## 2012-11-25 ENCOUNTER — Encounter (HOSPITAL_COMMUNITY): Payer: Self-pay | Admitting: Emergency Medicine

## 2012-11-25 VITALS — BP 123/65 | HR 66 | Temp 98.4°F | Resp 12 | Ht 64.0 in | Wt 162.2 lb

## 2012-11-25 DIAGNOSIS — K219 Gastro-esophageal reflux disease without esophagitis: Secondary | ICD-10-CM | POA: Diagnosis present

## 2012-11-25 DIAGNOSIS — Z79899 Other long term (current) drug therapy: Secondary | ICD-10-CM

## 2012-11-25 DIAGNOSIS — K839 Disease of biliary tract, unspecified: Secondary | ICD-10-CM

## 2012-11-25 DIAGNOSIS — R933 Abnormal findings on diagnostic imaging of other parts of digestive tract: Secondary | ICD-10-CM

## 2012-11-25 DIAGNOSIS — R1011 Right upper quadrant pain: Secondary | ICD-10-CM

## 2012-11-25 DIAGNOSIS — G8918 Other acute postprocedural pain: Principal | ICD-10-CM

## 2012-11-25 DIAGNOSIS — G43909 Migraine, unspecified, not intractable, without status migrainosus: Secondary | ICD-10-CM | POA: Diagnosis present

## 2012-11-25 DIAGNOSIS — Z09 Encounter for follow-up examination after completed treatment for conditions other than malignant neoplasm: Secondary | ICD-10-CM

## 2012-11-25 DIAGNOSIS — M479 Spondylosis, unspecified: Secondary | ICD-10-CM | POA: Diagnosis present

## 2012-11-25 DIAGNOSIS — K5730 Diverticulosis of large intestine without perforation or abscess without bleeding: Secondary | ICD-10-CM | POA: Diagnosis present

## 2012-11-25 HISTORY — DX: Unspecified osteoarthritis, unspecified site: M19.90

## 2012-11-25 LAB — COMPREHENSIVE METABOLIC PANEL
Albumin: 3.5 g/dL (ref 3.5–5.2)
Alkaline Phosphatase: 94 U/L (ref 39–117)
BUN: 13 mg/dL (ref 6–23)
CO2: 24 mEq/L (ref 19–32)
Chloride: 101 mEq/L (ref 96–112)
Creatinine, Ser: 0.66 mg/dL (ref 0.50–1.10)
GFR calc Af Amer: >90 mL/min (ref >90)
GFR calc non Af Amer: >90 mL/min (ref >90)
Glucose, Bld: 102 mg/dL — ABNORMAL HIGH (ref 70–99)
Potassium: 3.7 mEq/L (ref 3.5–5.1)
Total Bilirubin: 0.4 mg/dL (ref 0.3–1.2)

## 2012-11-25 LAB — CBC
HCT: 41 % (ref 36.0–46.0)
Hemoglobin: 14.1 g/dL (ref 12.0–15.0)
MCHC: 34.4 g/dL (ref 30.0–36.0)
MCV: 84.9 fL (ref 78.0–100.0)
RDW: 13.8 % (ref 11.5–15.5)

## 2012-11-25 LAB — CBC WITH DIFFERENTIAL/PLATELET
Basophils Relative: 1 % (ref 0–1)
HCT: 37.9 % (ref 36.0–46.0)
Hemoglobin: 13.6 g/dL (ref 12.0–15.0)
Lymphocytes Relative: 27 % (ref 12–46)
Lymphs Abs: 2.4 10*3/uL (ref 0.7–4.0)
MCHC: 35.9 g/dL (ref 30.0–36.0)
Monocytes Absolute: 0.7 10*3/uL (ref 0.1–1.0)
Monocytes Relative: 8 % (ref 3–12)
Neutro Abs: 5 10*3/uL (ref 1.7–7.7)
Neutrophils Relative %: 55 % (ref 43–77)
RBC: 4.54 MIL/uL (ref 3.87–5.11)

## 2012-11-25 MED ORDER — ESTROGENS CONJUGATED 0.9 MG PO TABS
0.9000 mg | ORAL_TABLET | Freq: Every day | ORAL | Status: DC
  Administered 2012-11-26 – 2012-11-27 (×2): 0.9 mg via ORAL
  Filled 2012-11-25 (×3): qty 1

## 2012-11-25 MED ORDER — PANTOPRAZOLE SODIUM 40 MG IV SOLR
40.0000 mg | Freq: Every day | INTRAVENOUS | Status: DC
  Administered 2012-11-26: 40 mg via INTRAVENOUS
  Filled 2012-11-25 (×5): qty 40

## 2012-11-25 MED ORDER — KETOROLAC TROMETHAMINE 30 MG/ML IJ SOLN
30.0000 mg | Freq: Four times a day (QID) | INTRAMUSCULAR | Status: AC
  Administered 2012-11-26 (×3): 30 mg via INTRAVENOUS
  Filled 2012-11-25 (×5): qty 1

## 2012-11-25 MED ORDER — ENOXAPARIN SODIUM 40 MG/0.4ML ~~LOC~~ SOLN
40.0000 mg | SUBCUTANEOUS | Status: DC
  Administered 2012-11-26 – 2012-11-27 (×2): 40 mg via SUBCUTANEOUS
  Filled 2012-11-25 (×4): qty 0.4

## 2012-11-25 MED ORDER — HYDROMORPHONE HCL PF 1 MG/ML IJ SOLN
1.0000 mg | Freq: Once | INTRAMUSCULAR | Status: AC
  Administered 2012-11-25: 1 mg via INTRAVENOUS
  Filled 2012-11-25: qty 1

## 2012-11-25 MED ORDER — POLYETHYLENE GLYCOL 3350 17 G PO PACK
17.0000 g | PACK | Freq: Every day | ORAL | Status: DC
  Administered 2012-11-26 – 2012-11-28 (×3): 17 g via ORAL
  Filled 2012-11-25 (×4): qty 1

## 2012-11-25 MED ORDER — HYDROCODONE-ACETAMINOPHEN 5-325 MG PO TABS
1.0000 | ORAL_TABLET | ORAL | Status: DC | PRN
  Administered 2012-11-26 – 2012-11-27 (×5): 2 via ORAL
  Filled 2012-11-25 (×5): qty 2

## 2012-11-25 MED ORDER — ONDANSETRON HCL 4 MG/2ML IJ SOLN
4.0000 mg | Freq: Four times a day (QID) | INTRAMUSCULAR | Status: DC | PRN
  Administered 2012-11-26 – 2012-11-27 (×3): 4 mg via INTRAVENOUS
  Filled 2012-11-25 (×3): qty 2

## 2012-11-25 MED ORDER — TEMAZEPAM 15 MG PO CAPS
15.0000 mg | ORAL_CAPSULE | Freq: Every evening | ORAL | Status: DC | PRN
  Administered 2012-11-28: 15 mg via ORAL
  Filled 2012-11-25: qty 1

## 2012-11-25 MED ORDER — POTASSIUM CHLORIDE IN NACL 20-0.9 MEQ/L-% IV SOLN
INTRAVENOUS | Status: DC
  Administered 2012-11-26: 125 mL/h via INTRAVENOUS
  Administered 2012-11-26 – 2012-11-28 (×5): via INTRAVENOUS
  Filled 2012-11-25 (×10): qty 1000

## 2012-11-25 MED ORDER — HYDROMORPHONE HCL PF 1 MG/ML IJ SOLN
1.0000 mg | INTRAMUSCULAR | Status: DC | PRN
  Administered 2012-11-26 (×2): 1 mg via INTRAVENOUS
  Filled 2012-11-25 (×2): qty 1

## 2012-11-25 NOTE — ED Notes (Signed)
PT. REPORTS RUQ PAIN ( INCISION SITE) UNRELIEVED BY PRESCRIPTION HYDROCODONE ONSET YESTERDAY WITH NAUSEA / CHILLS AND DIZZINESS S/P CHOLECYSTECTOMY LAST Friday BY DR. Rayburn Ma.

## 2012-11-25 NOTE — Telephone Encounter (Signed)
Patient scheduled to come into urgent office this afternoon to see Magnus Ivan MD.

## 2012-11-25 NOTE — Progress Notes (Signed)
Subjective:     Patient ID: Jessica Robinson, female   DOB: 24-Oct-1970, 42 y.o.   MRN: [id number]  HPI She is here for her first postop visit. She is 5 days postop from a laparoscopic cholecystectomy. She has been having persistent sharp right upper quadrant abdominal pain her into to the back. This was for chronic cholecystitis and not gallstones. She has not had emesis. She is moving her bowels. She denies fevers.  Review of Systems     Objective:   Physical Exam On exam, her incisions are well healed. She has mild right upper quadrant abdominal tenderness. The final pathology confirmed chronic cholecystitis.    Assessment:     Patient with postoperative abdominal pain     Plan:     Given the nature of her pain, I need to get a HIDA scan to rule out a postoperative bile leak. I will also check a CBC and a CMP. I will call her back with the results.

## 2012-11-25 NOTE — Telephone Encounter (Signed)
Does she need to come to the urgent office today?

## 2012-11-25 NOTE — ED Provider Notes (Signed)
History     CSN: [id number]  Arrival date & time 11/25/12  2014   First MD Initiated Contact with Patient 11/25/12 2120      Chief Complaint  Patient presents with  . Abdominal Pain    (Consider location/radiation/quality/duration/timing/severity/associated sxs/prior treatment) HPI History provided by pt and prior chart.  Per prior chart, pt had a cholecystectomy 5 days ago for chronic cholecystitis.  Had first post-op visit w/ Dr. Magnus Ivan today and HIDA scan scheduled for tomorrow at 6:45.  Pt has had persistent, sharp RUQ pain that is gradually worsening and refractory to vicodin.  Pain is worse now than it was at her appointment today.  No associated fever, N/V, change in bowel, urinary sx or drainage from incisions.    Past Medical History  Diagnosis Date  . Endometriosis of ovary   . 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 Diverticular disease   . Colitis   . GERD (gastroesophageal reflux disease)     will gallbladder  . Complication of anesthesia     "heart rate always drops down low" (11/25/2012)  . Migraines   . Arthritis     "in my back" (11/25/2012)    Past Surgical History  Procedure Laterality Date  . Foot neuroma surgery Right ~ 2008  . Biceps tendon repair Right   . Cholecystectomy N/A 11/20/2012    Procedure: LAPAROSCOPIC CHOLECYSTECTOMY possible IOC ;  Surgeon: Shelly Rubenstein, MD;  Location: [facility] OR;  Service: General;  Laterality: N/A;  . Shoulder arthroscopy w/ rotator cuff repair Right 06/2011    "w/bicep tendon repair" (11/25/2012)  . Abdominal hysterectomy  1997  . Augmentation mammaplasty Bilateral 2003    Family History  Problem Relation Age of Onset  . Cancer Father     waldenstroms  . Cancer Sister     Raj Janus  . Cancer Paternal Aunt     breast  . Cancer Paternal Grandmother     breat    History  Substance Use Topics  . Smoking status: Never Smoker   . Smokeless tobacco: Never Used  . Alcohol Use: Yes     Comment: 11/25/2012 "couple times a  month I might have 1/2 a beer"    OB History   Grav Para Term Preterm Abortions TAB SAB Ect Mult Living                  Review of Systems  All other systems reviewed and are negative.    Allergies  Adhesive; Macrolides and ketolides; and Phenergan  Home Medications   No current outpatient prescriptions on file.  BP 105/68  Pulse 62  Temp(Src) 98 F (36.7 C) (Oral)  Resp 18  SpO2 99%  Physical Exam  Nursing note and vitals reviewed. Constitutional: She is oriented to person, place, and time. She appears well-developed and well-nourished. No distress.  Uncomfortable appearing  HENT:  Head: Normocephalic and atraumatic.  Eyes:  Normal appearance  Neck: Normal range of motion.  Cardiovascular: Normal rate and regular rhythm.   Pulmonary/Chest: Effort normal and breath sounds normal. No respiratory distress.  Abdominal: Soft. Bowel sounds are normal. She exhibits no distension and no mass. There is no rebound.  Incisions appear to be healing well and are w/out erythema and drainage.  Mild tenderness in RLQ and severe tenderness RUQ w/ guarding.    Genitourinary:  No CVA tenderness  Musculoskeletal: Normal range of motion.  Neurological: She is alert and oriented to person, place, and time.  Skin: Skin is  warm and dry. No rash noted.  Psychiatric: She has a normal mood and affect. Her behavior is normal.    ED Course  Procedures (including critical care time)  Labs Reviewed  CBC WITH DIFFERENTIAL - Abnormal; Notable for the following:    Eosinophils Relative 10 (*)    Eosinophils Absolute 0.9 (*)    All other components within normal limits  COMPREHENSIVE METABOLIC PANEL - Abnormal; Notable for the following:    Sodium 134 (*)    Glucose, Bld 102 (*)    All other components within normal limits   Dg Abd 1 View  11/26/2012  *RADIOLOGY REPORT*  Clinical Data: Abdominal pain  ABDOMEN - 1 VIEW  Comparison: None.  Findings: Prominent stool burden throughout the  colon.  No disproportionate dilatation of bowel.  No obvious free intraperitoneal gas.  Postoperative changes.  Phleboliths project over the right hemi pelvis.  IMPRESSION: Prominent stool burden.  Nonobstructive bowel gas pattern.   Original Report Authenticated By: Jolaine Click, M.D.      1. Postoperative pain       MDM  42yo F, 5 days post-op cholecystectomy, presents w/ acutely worsened pain.  Had f/u with Dr. Magnus Ivan earlier today and HIDA scan scheduled for tomorrow to r/o bile leak, but pain now intolerable and refractory to vicodin.  On exam, afebrile, uncomfortable appearing, well-healing surgical incisions, severe RUQ tenderness w/ guarding.  Labs unremarkable.  Consulted Dr. Magnus Ivan and he will admit.  1mg  IV dilaudid ordered for pain.         Otilio Miu, PA-C 11/26/12 [id number]

## 2012-11-25 NOTE — Telephone Encounter (Signed)
Patient called to state severe "kidney" pain.  Patient states she has been unable to sleep due to the pain.  Patient denies any odor or change in color to her urine.  Patient is unable to find anything that proves her relief.  Explained to patient this RN unsure of the connection of this symptom with her surgery.  Awaiting further instructions from Dr. Magnus Ivan.

## 2012-11-26 ENCOUNTER — Inpatient Hospital Stay (HOSPITAL_COMMUNITY): Payer: PRIVATE HEALTH INSURANCE

## 2012-11-26 ENCOUNTER — Encounter (HOSPITAL_COMMUNITY): Payer: Self-pay | Admitting: Radiology

## 2012-11-26 ENCOUNTER — Encounter (HOSPITAL_COMMUNITY): Payer: PRIVATE HEALTH INSURANCE

## 2012-11-26 LAB — COMPREHENSIVE METABOLIC PANEL
AST: 23 U/L (ref 0–37)
Albumin: 4.2 g/dL (ref 3.5–5.2)
Alkaline Phosphatase: 91 U/L (ref 39–117)
BUN: 14 mg/dL (ref 6–23)
Calcium: 9.6 mg/dL (ref 8.4–10.5)
Chloride: 101 mEq/L (ref 96–112)
Creat: 0.83 mg/dL (ref 0.50–1.10)
Glucose, Bld: 92 mg/dL (ref 70–99)

## 2012-11-26 MED ORDER — FLEET ENEMA 7-19 GM/118ML RE ENEM
1.0000 | ENEMA | Freq: Once | RECTAL | Status: AC
  Administered 2012-11-26: 1 via RECTAL
  Filled 2012-11-26: qty 1

## 2012-11-26 MED ORDER — IOHEXOL 300 MG/ML  SOLN
25.0000 mL | INTRAMUSCULAR | Status: AC
  Administered 2012-11-26 (×2): 25 mL via ORAL

## 2012-11-26 MED ORDER — TECHNETIUM TC 99M MEBROFENIN IV KIT
5.0000 | PACK | Freq: Once | INTRAVENOUS | Status: AC | PRN
  Administered 2012-11-26: 5 via INTRAVENOUS

## 2012-11-26 MED ORDER — IOHEXOL 300 MG/ML  SOLN
80.0000 mL | Freq: Once | INTRAMUSCULAR | Status: AC | PRN
  Administered 2012-11-26: 80 mL via INTRAVENOUS

## 2012-11-26 NOTE — ED Provider Notes (Signed)
Medical screening examination/treatment/procedure(s) were conducted as a shared visit with non-physician practitioner(s) and myself.  I personally evaluated the patient during the encounter   Jessica Racer, MD 11/26/12 1512

## 2012-11-26 NOTE — H&P (Signed)
Jessica Robinson is an 42 y.o. female.   Chief Complaint: Right sided abdominal pain HPI: She is status post laparoscopic cholecystectomy performed on March 28 for chronic cholecystitis. Since surgery, she has been having worsening sharp right upper quadrant abdominal pain. It hurts through to the back. It hurts with both lying and standing. She denies nausea or vomiting. She denies constipation. She also denies chills. I saw her in the office and ordered a HIDA scan. She presented to the ER and worsening discomfort. Scan is scheduled for this morning.  Past Medical History  Diagnosis Date  . Endometriosis of ovary   . 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 Diverticular disease   . Colitis   . GERD (gastroesophageal reflux disease)     will gallbladder  . Complication of anesthesia     "heart rate always drops down low" (11/25/2012)  . Migraines   . Arthritis     "in my back" (11/25/2012)    Past Surgical History  Procedure Laterality Date  . Foot neuroma surgery Right ~ 2008  . Biceps tendon repair Right   . Cholecystectomy N/A 11/20/2012    Procedure: LAPAROSCOPIC CHOLECYSTECTOMY possible IOC ;  Surgeon: Shelly Rubenstein, MD;  Location: [facility] OR;  Service: General;  Laterality: N/A;  . Shoulder arthroscopy w/ rotator cuff repair Right 06/2011    "w/bicep tendon repair" (11/25/2012)  . Abdominal hysterectomy  1997  . Augmentation mammaplasty Bilateral 2003    Family History  Problem Relation Age of Onset  . Cancer Father     waldenstroms  . Cancer Sister     Raj Janus  . Cancer Paternal Aunt     breast  . Cancer Paternal Grandmother     breat   Social History:  reports that she has never smoked. She has never used smokeless tobacco. She reports that  drinks alcohol. She reports that she does not use illicit drugs.  Allergies:  Allergies  Allergen Reactions  . Adhesive (Tape)     redness  . Macrolides And Ketolides     Pt has never heard of either med  . Phenergan (Promethazine  Hcl) Itching    Medications Prior to Admission  Medication Sig Dispense Refill  . estrogens, conjugated, (PREMARIN) 0.9 MG tablet Take 0.9 mg by mouth daily. Take daily for 21 days then do not take for 7 days.      . hydrochlorothiazide (HYDRODIURIL) 12.5 MG tablet Take 12.5 mg by mouth daily as needed (for fluid retention).       Marland Kitchen HYDROcodone-acetaminophen (NORCO/VICODIN) 5-325 MG per tablet Take 1 tablet by mouth every 4 (four) hours as needed for pain.      Marland Kitchen ondansetron (ZOFRAN) 8 MG tablet Take 1 tablet (8 mg total) by mouth 2 (two) times daily as needed for nausea.  20 tablet  1  . temazepam (RESTORIL) 15 MG capsule Take 15 mg by mouth at bedtime as needed for sleep.        Results for orders placed during the hospital encounter of 11/25/12 (from the past 48 hour(s))  CBC WITH DIFFERENTIAL     Status: Abnormal   Collection Time    11/25/12  8:31 PM      Result Value Range   WBC 9.1  4.0 - 10.5 K/uL   RBC 4.54  3.87 - 5.11 MIL/uL   Hemoglobin 13.6  12.0 - 15.0 g/dL   HCT 16.1  09.6 - 04.5 %   MCV 83.5  78.0 - 100.0 fL  MCH 30.0  26.0 - 34.0 pg   MCHC 35.9  30.0 - 36.0 g/dL   RDW 16.1  09.6 - 04.5 %   Platelets 245  150 - 400 K/uL   Neutrophils Relative 55  43 - 77 %   Neutro Abs 5.0  1.7 - 7.7 K/uL   Lymphocytes Relative 27  12 - 46 %   Lymphs Abs 2.4  0.7 - 4.0 K/uL   Monocytes Relative 8  3 - 12 %   Monocytes Absolute 0.7  0.1 - 1.0 K/uL   Eosinophils Relative 10 (*) 0 - 5 %   Eosinophils Absolute 0.9 (*) 0.0 - 0.7 K/uL   Basophils Relative 1  0 - 1 %   Basophils Absolute 0.1  0.0 - 0.1 K/uL  COMPREHENSIVE METABOLIC PANEL     Status: Abnormal   Collection Time    11/25/12  8:31 PM      Result Value Range   Sodium 134 (*) 135 - 145 mEq/L   Potassium 3.7  3.5 - 5.1 mEq/L   Chloride 101  96 - 112 mEq/L   CO2 24  19 - 32 mEq/L   Glucose, Bld 102 (*) 70 - 99 mg/dL   BUN 13  6 - 23 mg/dL   Creatinine, Ser 4.09  0.50 - 1.10 mg/dL   Calcium 9.1  8.4 - 81.1 mg/dL    Total Protein 7.1  6.0 - 8.3 g/dL   Albumin 3.5  3.5 - 5.2 g/dL   AST 32  0 - 37 U/L   ALT 30  0 - 35 U/L   Alkaline Phosphatase 94  39 - 117 U/L   Total Bilirubin 0.4  0.3 - 1.2 mg/dL   GFR calc non Af Amer >90  >90 mL/min   GFR calc Af Amer >90  >90 mL/min   Comment:            The eGFR has been calculated     using the CKD EPI equation.     This calculation has not been     validated in all clinical     situations.     eGFR's persistently     <90 mL/min signify     possible Chronic Kidney Disease.   Dg Abd 1 View  11/26/2012  *RADIOLOGY REPORT*  Clinical Data: Abdominal pain  ABDOMEN - 1 VIEW  Comparison: None.  Findings: Prominent stool burden throughout the colon.  No disproportionate dilatation of bowel.  No obvious free intraperitoneal gas.  Postoperative changes.  Phleboliths project over the right hemi pelvis.  IMPRESSION: Prominent stool burden.  Nonobstructive bowel gas pattern.   Original Report Authenticated By: Jolaine Click, M.D.     Review of Systems  All other systems reviewed and are negative.    Blood pressure 105/68, pulse 62, temperature 98 F (36.7 C), temperature source Oral, resp. rate 18, SpO2 99.00%. Physical Exam  Constitutional: She is oriented to person, place, and time.  She is fairly comfortable and appearance  HENT:  Head: Normocephalic and atraumatic.  Eyes: Conjunctivae are normal. Pupils are equal, round, and reactive to light.  Neck: Normal range of motion. Neck supple.  Cardiovascular: Normal rate, regular rhythm, normal heart sounds and intact distal pulses.   Respiratory: Effort normal and breath sounds normal. No respiratory distress. She has no wheezes.  GI: Soft. Bowel sounds are normal. She exhibits no distension. There is no rebound.  Her incisions are well healed. Her abdomen is soft with mild  right upper quadrant tenderness  Neurological: She is alert and oriented to person, place, and time.  Skin: Skin is warm and dry.   Psychiatric: Her behavior is normal. Judgment normal.     Assessment/Plan Postop abdominal pain of uncertain etiology status post laparoscopic cholecystectomy  Her laboratory data including liver function tests are normal. Her x-rays show a large amount of stool in her colon. Nonetheless, a HIDA scan will be performed this morning to rule out a bile leak given her significant discomfort. If a bile leak is present, I will consult gastroenterology.  Dennys Guin A 11/26/2012, 5:45 AM

## 2012-11-26 NOTE — Progress Notes (Signed)
Pt vomited this morning bilius / green in color, c/o of nausea.

## 2012-11-27 ENCOUNTER — Inpatient Hospital Stay (HOSPITAL_COMMUNITY): Payer: PRIVATE HEALTH INSURANCE

## 2012-11-27 ENCOUNTER — Encounter (HOSPITAL_COMMUNITY)
Admission: EM | Disposition: A | Discharge status: Home / Self Care | Payer: Self-pay | Source: Home / Self Care | Attending: Surgery

## 2012-11-27 ENCOUNTER — Encounter (HOSPITAL_COMMUNITY): Payer: Self-pay | Admitting: Gastroenterology

## 2012-11-27 HISTORY — PX: ERCP: SHX5425

## 2012-11-27 LAB — COMPREHENSIVE METABOLIC PANEL
ALT: 24 U/L (ref 0–35)
AST: 21 U/L (ref 0–37)
Albumin: 2.8 g/dL — ABNORMAL LOW (ref 3.5–5.2)
Alkaline Phosphatase: 105 U/L (ref 39–117)
Chloride: 103 mEq/L (ref 96–112)
Potassium: 4.7 mEq/L (ref 3.5–5.1)
Sodium: 135 mEq/L (ref 135–145)
Total Bilirubin: 1 mg/dL (ref 0.3–1.2)
Total Protein: 6.1 g/dL (ref 6.0–8.3)

## 2012-11-27 SURGERY — ERCP, WITH INTERVENTION IF INDICATED
Anesthesia: Moderate Sedation

## 2012-11-27 MED ORDER — SODIUM CHLORIDE 0.9 % IV SOLN
INTRAVENOUS | Status: DC
  Administered 2012-11-27: 500 mL via INTRAVENOUS

## 2012-11-27 MED ORDER — DIPHENHYDRAMINE HCL 50 MG/ML IJ SOLN
INTRAMUSCULAR | Status: DC | PRN
  Administered 2012-11-27 (×2): 25 mg via INTRAVENOUS

## 2012-11-27 MED ORDER — BUTAMBEN-TETRACAINE-BENZOCAINE 2-2-14 % EX AERO
INHALATION_SPRAY | CUTANEOUS | Status: DC | PRN
  Administered 2012-11-27: 1 via TOPICAL

## 2012-11-27 MED ORDER — FENTANYL CITRATE 0.05 MG/ML IJ SOLN
25.0000 ug | Freq: Once | INTRAMUSCULAR | Status: AC
  Administered 2012-11-27: 25 ug via INTRAVENOUS

## 2012-11-27 MED ORDER — FENTANYL CITRATE 0.05 MG/ML IJ SOLN
INTRAMUSCULAR | Status: DC | PRN
  Administered 2012-11-27 (×5): 25 ug via INTRAVENOUS

## 2012-11-27 MED ORDER — SODIUM CHLORIDE 0.9 % IV SOLN
INTRAVENOUS | Status: DC | PRN
  Administered 2012-11-27: 16:00:00

## 2012-11-27 MED ORDER — CIPROFLOXACIN IN D5W 400 MG/200ML IV SOLN
400.0000 mg | Freq: Once | INTRAVENOUS | Status: AC
  Administered 2012-11-27: 400 mg via INTRAVENOUS
  Filled 2012-11-27: qty 200

## 2012-11-27 MED ORDER — MIDAZOLAM HCL 10 MG/2ML IJ SOLN
INTRAMUSCULAR | Status: DC | PRN
  Administered 2012-11-27 (×5): 2 mg via INTRAVENOUS

## 2012-11-27 MED ORDER — SODIUM CHLORIDE 0.9 % IV SOLN: INTRAVENOUS | Status: DC

## 2012-11-27 NOTE — Consult Note (Signed)
Reason for Consult: RUQ pain. Referring Physician: Abigail Miyamoto, M.D.  Jessica Robinson HPI: This is a 42 year old female s/p lap chole by Dr. Magnus Ivan on 11/20/2012 without incident.  The biopsy results revealed that she had chronic cholecystitis.  Since her surgery she complains of a sharp RUQ abdominal pain and imaging was negative for any abnormalities.  The HIDA scan reveals excretion of the tracer into the duodenum.  CT scan was also negative for any overt abnormalities aside from some edema in the porta hepatis, which can be post surgical.  The RUQ U/S today revealed a mild CBD dilation at 8 mm. A GI consultation was requested to further evaluation.  Past Medical History  Diagnosis Date  . Endometriosis of ovary   . 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 Diverticular disease   . Colitis   . GERD (gastroesophageal reflux disease)     will gallbladder  . Complication of anesthesia     "heart rate always drops down low" (11/25/2012)  . Migraines   . Arthritis     "in my back" (11/25/2012)    Past Surgical History  Procedure Laterality Date  . Foot neuroma surgery Right ~ 2008  . Biceps tendon repair Right   . Cholecystectomy N/A 11/20/2012    Procedure: LAPAROSCOPIC CHOLECYSTECTOMY possible IOC ;  Surgeon: Shelly Rubenstein, MD;  Location: [facility] OR;  Service: General;  Laterality: N/A;  . Shoulder arthroscopy w/ rotator cuff repair Right 06/2011    "w/bicep tendon repair" (11/25/2012)  . Abdominal hysterectomy  1997  . Augmentation mammaplasty Bilateral 2003    Family History  Problem Relation Age of Onset  . Cancer Father     waldenstroms  . Cancer Sister     Raj Janus  . Cancer Paternal Aunt     breast  . Cancer Paternal Grandmother     breat    Social History:  reports that she has never smoked. She has never used smokeless tobacco. She reports that  drinks alcohol. She reports that she does not use illicit drugs.  Allergies:  Allergies  Allergen Reactions  . Adhesive  (Tape)     redness  . Macrolides And Ketolides     Pt has never heard of either med  . Phenergan (Promethazine Hcl) Itching    Medications:  Scheduled: . enoxaparin (LOVENOX) injection  40 mg Subcutaneous Q24H  . estrogens (conjugated)  0.9 mg Oral Daily  . pantoprazole (PROTONIX) IV  40 mg Intravenous QHS  . polyethylene glycol  17 g Oral Daily   Continuous: . sodium chloride    . 0.9 % NaCl with KCl 20 mEq / L 125 mL/hr at 11/27/12 4098    Results for orders placed during the hospital encounter of 11/25/12 (from the past 24 hour(s))  COMPREHENSIVE METABOLIC PANEL     Status: Abnormal   Collection Time    11/27/12  5:52 AM      Result Value Range   Sodium 135  135 - 145 mEq/L   Potassium 4.7  3.5 - 5.1 mEq/L   Chloride 103  96 - 112 mEq/L   CO2 27  19 - 32 mEq/L   Glucose, Bld 104 (*) 70 - 99 mg/dL   BUN 9  6 - 23 mg/dL   Creatinine, Ser 1.19  0.50 - 1.10 mg/dL   Calcium 8.2 (*) 8.4 - 10.5 mg/dL   Total Protein 6.1  6.0 - 8.3 g/dL   Albumin 2.8 (*) 3.5 - 5.2 g/dL  AST 21  0 - 37 U/L   ALT 24  0 - 35 U/L   Alkaline Phosphatase 105  39 - 117 U/L   Total Bilirubin 1.0  0.3 - 1.2 mg/dL   GFR calc non Af Amer >90  >90 mL/min   GFR calc Af Amer >90  >90 mL/min     Dg Abd 1 View  11/26/2012  *RADIOLOGY REPORT*  Clinical Data: Abdominal pain  ABDOMEN - 1 VIEW  Comparison: None.  Findings: Prominent stool burden throughout the colon.  No disproportionate dilatation of bowel.  No obvious free intraperitoneal gas.  Postoperative changes.  Phleboliths project over the right hemi pelvis.  IMPRESSION: Prominent stool burden.  Nonobstructive bowel gas pattern.   Original Report Authenticated By: Jolaine Click, M.D.    Nm Hepatobiliary  11/26/2012  *RADIOLOGY REPORT*  Clinical Data: Recent cholecystectomy with pain  NUCLEAR MEDICINE HEPATOHBILIARY INCLUDE GB  Views:  Anterior right upper quadrant  Radiopharmaceutical:  Technetium 20m Choletec  Dose:  5.0 mCi  Route of administration:   Intravenous  Findings: Liver uptake of radiotracer is normal.  There is prompt visualization of small bowel, indicating patency of the common bile duct.  Gallbladder is known to be absent.  No ectopic radiotracer to suggest bile leak is appreciated on this study.  IMPRESSION: No demonstrable bile leak.  Common bile duct patent. The liver uptake is normal.  Gallbladder  absent.   Original Report Authenticated By: Bretta Bang, M.D.    US Abdomen Complete  11/27/2012  *RADIOLOGY REPORT*  Clinical Data:  Prior cholecystectomy.  Periportal edema and possible biliary dilatation on CT.  ABDOMINAL ULTRASOUND COMPLETE  Comparison:  CT on 11/26/2012  Findings:  Gallbladder:  Surgically absent.  Common Bile Duct:  Common bile duct measures up to 8 mm in diameter, which is mildly dilated.  No definite evidence of intrahepatic biliary dilatation seen.  This could be related to prior cholecystectomy.  Liver: No focal mass lesion identified.  Within normal limits in parenchymal echogenicity.  9 mm porta hepatis lymph node is noted, which is not pathologically enlarged.  IVC:  Appears normal.  Pancreas:  No abnormality identified.  Spleen:  Within normal limits in size and echotexture.  Right kidney:  Normal in size and parenchymal echogenicity.  No evidence of mass or hydronephrosis.  Left kidney:  Normal in size and parenchymal echogenicity.  No evidence of mass or hydronephrosis.  Abdominal Aorta:  No aneurysm identified.  IMPRESSION:  1.  Prior cholecystectomy. 2.  Mildly dilated common bile duct measuring 8 mm, however no definite evidence of intrahepatic ductal dilatation.  Recommend correlation with liver function tests; if abnormal, consider MRCP for further evaluation.   Original Report Authenticated By: Myles Rosenthal, M.D.    Ct Abdomen Pelvis W Contrast  11/26/2012  *RADIOLOGY REPORT*  Clinical Data: Postoperative abdominal pain. Status post cholecystectomy.  CT ABDOMEN AND PELVIS WITH CONTRAST  Technique:   Multidetector CT imaging of the abdomen and pelvis was performed following the standard protocol during bolus administration of intravenous contrast.  Contrast: 80mL OMNIPAQUE IOHEXOL 300 MG/ML  SOLN  Comparison: Abdominal ultrasound 10/06/2012. Report of CT abdomen pelvis without contrast 01/26/2010.  Findings: Mild dependent atelectasis is present bilaterally.  The lungs are otherwise clear. The heart size is normal.  No significant pleural or pericardial effusion is present.  Moderate periportal edema is present.  The common hepatic duct is dilated at the porta hepatis, above the level of the cholecystectomy.  The patient is  status post cholecystectomy. Minimal inflammatory changes reside in the cholecystectomy bed. The spleen is within normal limits.  Stomach, duodenum, and pancreas are normal.  The adrenal glands are normal bilaterally.  The kidneys are unremarkable.  The ureters and urinary bladder are within normal limits.  The rectosigmoid colon is within normal limits.  A small amount of free fluid is layering within the anatomic pelvis.  The remainder the colon is unremarkable.  The appendix is visualized and normal. Small bowel is within normal limits.  No significant adenopathy is present.  The bone windows are unremarkable.  IMPRESSION:  1.  Status post cholecystectomy. 2.  Moderate periportal edema and dilation of the common hepatic duct, above the surgical site.  This may represent focal obstruction.  Ultrasound may be useful for further evaluation. 3.  Minimal fluid layering in the anatomic pelvis likely reactive.   Original Report Authenticated By: Marin Roberts, M.D.     ROS:  As stated above in the HPI otherwise negative.  Blood pressure 95/64, pulse 61, temperature 97.9 F (36.6 C), temperature source Oral, resp. rate 19, height 5\' 5"  (1.651 m), weight 149 lb 14.4 oz (67.994 kg), SpO2 100.00%.    PE: Gen: NAD, Alert and Oriented HEENT:  Halfway/AT, EOMI Neck: Supple, no LAD Lungs:  CTA Bilaterally CV: RRR without M/G/R ABM: Soft, tender in the RUQ, +BS Ext: No C/C/E  Assessment/Plan: 1) ? Bile duct leak. 2) RUQ pain. 3) S/p lap chole.   I reviewed the imaging scans and her blood work.  I am suspicious for a bile duct leak that is not showing up at this time.  I have encountered a couple of similar patients in the past with this type of presentation only to be diagnosed with bile duct leak with repeat imaging.  I discussed the case with Radiology to determine the sensitivity of an MRCP with detecting a bile duct leak, but he answer is unknown at this time. It is not routinely performed to detect this type of abnormality.  At this time I will opt to perform an ERCP and if there is a leak I can place a stent.  Plan: 1) ERCP with possible stent placement.  Aidan Caloca D 11/27/2012, 10:48 AM

## 2012-11-27 NOTE — Op Note (Signed)
[facility] [address] [place], [address]   ERCP PROCEDURE REPORT  PATIENT: Jessica Robinson, Jessica Robinson.  MR# :[id number] BIRTHDATE: 10-Apr-1971  GENDER: Female ENDOSCOPIST: Jeani Hawking, MD REFERRED BY: PROCEDURE DATE:  11/27/2012 PROCEDURE:   ERCP with stent placement ASA CLASS:   Class II INDICATIONS:abnormal abdominal ultrasound. MEDICATIONS: Versed 10 mg IV, Fentanyl 125 mcg IV, and Benadryl 50 mg IV TOPICAL ANESTHETIC: Cetacaine Spray  DESCRIPTION OF PROCEDURE:   After the risks benefits and alternatives of the procedure were thoroughly explained, informed consent was obtained.  The     endoscope was introduced through the mouth  and advanced to the second portion of the duodenum . The ampulla was identified and there was evidence of spontaneous drainage of bile.  The PD was cannulated several times and at one point the guidewire was left in the PD to facilitate the cannulation of the CBD with a secondary wire.  Unfortunately the PD wire fell out of position, but the CBD was able to be cannulated rather quickly afterwards.  The guidwire was secured in the right intrahepatic ducts and contrast injection was performed.  There was no evidence of any duct leak at the cystic duct or the gallbladder bed.  The CBD and intrahepatic ducts were normal in caliber.  As I was attempting to perform a balloon cholangiogram the patient became extremely restless.  The balloon cholangiogram attempt was abandoned and the decision was made to place a biliary stent.  It may be that a bile leak was not evident and she would benefit with the stent.  A small sphincterotomy was created and the 7 Fr x 5 cm biliary stent was quickly placed into the CBD.  By this point the patient was extremely difficult to control.  Fluroscopic imaging confirmed the position of the stent..   The scope was then completely withdrawn from the patient and the procedure  terminated.     COMPLICATIONS: .  There were no complications.  ENDOSCOPIC IMPRESSION: 1) No evidence of a bile duct leak.  RECOMMENDATIONS: 1) Follow clinically for resolution or improvement of her pain. 2) Remove the stent in 1-3 months.     _______________________________ eSigned:  Jeani Hawking, MD 11/27/2012 4:11 PM   XB:[id number] Magnus Ivan, MD

## 2012-11-27 NOTE — Progress Notes (Signed)
Subjective: Patient still having moderate to severe RUQ abdominal pain. Had BM yesterday  Objective: Vital signs in last 24 hours: Temp:  [97.7 F (36.5 C)-97.9 F (36.6 C)] 97.7 F (36.5 C) (04/04 0241) Pulse Rate:  [60-68] 68 (04/04 0241) Resp:  [18-19] 19 (04/04 0241) BP: (98-104)/(59-67) 98/67 mmHg (04/04 0241) SpO2:  [98 %-100 %] 100 % (04/04 0241) Last BM Date: 11/26/12  Intake/Output from previous day: 04/03 0701 - 04/04 0700 In: 3242 [P.O.:1080; I.V.:2162] Out: 150 [Urine:150] Intake/Output this shift: Total I/O In: 675 [I.V.:675] Out: -   Abdomen is soft, Non distended, minimally tender  Lab Results:   Recent Labs  11/25/12 1525 11/25/12 2031  WBC 8.4 9.1  HGB 14.1 13.6  HCT 41.0 37.9  PLT 285 245   BMET  Recent Labs  11/25/12 1525 11/25/12 2031  NA 138 134*  K 4.3 3.7  CL 101 101  CO2 25 24  GLUCOSE 92 102*  BUN 14 13  CREATININE 0.83 0.66  CALCIUM 9.6 9.1   PT/INR No results found for this basename: LABPROT, INR,  in the last 72 hours ABG No results found for this basename: PHART, PCO2, PO2, HCO3,  in the last 72 hours  Studies/Results: Dg Abd 1 View  11/26/2012  *RADIOLOGY REPORT*  Clinical Data: Abdominal pain  ABDOMEN - 1 VIEW  Comparison: None.  Findings: Prominent stool burden throughout the colon.  No disproportionate dilatation of bowel.  No obvious free intraperitoneal gas.  Postoperative changes.  Phleboliths project over the right hemi pelvis.  IMPRESSION: Prominent stool burden.  Nonobstructive bowel gas pattern.   Original Report Authenticated By: Jolaine Click, M.D.    Nm Hepatobiliary  11/26/2012  *RADIOLOGY REPORT*  Clinical Data: Recent cholecystectomy with pain  NUCLEAR MEDICINE HEPATOHBILIARY INCLUDE GB  Views:  Anterior right upper quadrant  Radiopharmaceutical:  Technetium 27m Choletec  Dose:  5.0 mCi  Route of administration:  Intravenous  Findings: Liver uptake of radiotracer is normal.  There is prompt visualization of  small bowel, indicating patency of the common bile duct.  Gallbladder is known to be absent.  No ectopic radiotracer to suggest bile leak is appreciated on this study.  IMPRESSION: No demonstrable bile leak.  Common bile duct patent. The liver uptake is normal.  Gallbladder  absent.   Original Report Authenticated By: Bretta Bang, M.D.    Ct Abdomen Pelvis W Contrast  11/26/2012  *RADIOLOGY REPORT*  Clinical Data: Postoperative abdominal pain. Status post cholecystectomy.  CT ABDOMEN AND PELVIS WITH CONTRAST  Technique:  Multidetector CT imaging of the abdomen and pelvis was performed following the standard protocol during bolus administration of intravenous contrast.  Contrast: 80mL OMNIPAQUE IOHEXOL 300 MG/ML  SOLN  Comparison: Abdominal ultrasound 10/06/2012. Report of CT abdomen pelvis without contrast 01/26/2010.  Findings: Mild dependent atelectasis is present bilaterally.  The lungs are otherwise clear. The heart size is normal.  No significant pleural or pericardial effusion is present.  Moderate periportal edema is present.  The common hepatic duct is dilated at the porta hepatis, above the level of the cholecystectomy.  The patient is status post cholecystectomy. Minimal inflammatory changes reside in the cholecystectomy bed. The spleen is within normal limits.  Stomach, duodenum, and pancreas are normal.  The adrenal glands are normal bilaterally.  The kidneys are unremarkable.  The ureters and urinary bladder are within normal limits.  The rectosigmoid colon is within normal limits.  A small amount of free fluid is layering within the anatomic pelvis.  The remainder the colon is unremarkable.  The appendix is visualized and normal. Small bowel is within normal limits.  No significant adenopathy is present.  The bone windows are unremarkable.  IMPRESSION:  1.  Status post cholecystectomy. 2.  Moderate periportal edema and dilation of the common hepatic duct, above the surgical site.  This may  represent focal obstruction.  Ultrasound may be useful for further evaluation. 3.  Minimal fluid layering in the anatomic pelvis likely reactive.   Original Report Authenticated By: Marin Roberts, M.D.     Anti-infectives: Anti-infectives   None      Assessment/Plan: s/p * No surgery found *  RUQ post op abdominal pain of uncertain etiology.  HIDA scan negative for bile leak or CBD obstruction.  LFT's currently normal x 2.  Labs this morning pending.  CT scan shows periportal edema and dilation of the common hepatic duct.  Ultrasound pending (per radiology recommendations).  Will consult GI.  I think she will need an ERCP to see is there is a bile leak or narrowing of the CBD.  LOS: 2 days    Geroge Gilliam A 11/27/2012

## 2012-11-28 DIAGNOSIS — R933 Abnormal findings on diagnostic imaging of other parts of digestive tract: Secondary | ICD-10-CM

## 2012-11-28 DIAGNOSIS — R1011 Right upper quadrant pain: Secondary | ICD-10-CM

## 2012-11-28 DIAGNOSIS — G8918 Other acute postprocedural pain: Principal | ICD-10-CM

## 2012-11-28 HISTORY — DX: Right upper quadrant pain: R10.11

## 2012-11-28 HISTORY — DX: Abnormal findings on diagnostic imaging of other parts of digestive tract: R93.3

## 2012-11-28 MED ORDER — SIMETHICONE 80 MG PO CHEW
80.0000 mg | CHEWABLE_TABLET | Freq: Four times a day (QID) | ORAL | Status: DC | PRN
  Administered 2012-11-28: 80 mg via ORAL
  Filled 2012-11-28 (×2): qty 1

## 2012-11-28 MED ORDER — SIMETHICONE 80 MG PO CHEW: 80.0000 mg | CHEWABLE_TABLET | Freq: Four times a day (QID) | ORAL | Status: DC | PRN

## 2012-11-28 NOTE — Progress Notes (Signed)
Patient and husband given discharge instructions and prescriptions.  They verbalized understanding of all instructions and follow-up appt.  Pt more comfortable except for breakthrough gas pains...simethicone given as ordered.  Pt taken down for discharge via wheelchair and going home with husband.

## 2012-11-28 NOTE — Discharge Summary (Signed)
Physician Discharge Summary  Patient ID: Jessica Robinson MRN: [id number] DOB/AGE: 1970-11-10 42 y.o.  Admit date: 11/25/2012 Discharge date: 11/28/2012  Admission Diagnoses:RUQ pain after lap chole  Discharge Diagnoses: No evidence of bile leak on HIDA scan nor on ERCP. Active Problems:   Nonspecific (abnormal) findings on radiological and other examination of gastrointestinal tract   Abdominal pain, right upper quadrant   Discharged Condition: good  Hospital Course: Patient was admitted with right upper quadrant pain after laparoscopic cholecystectomy. HIDA skin was negative for leak. She underwent ERCP by Dr. Elnoria Howard.  No bile leak was noted. A stent was placed in the common bile duct. She is feeling significantly better this morning. She has not taken any pain medication. She still has some mild gas pain for which I ordered simethicone. Will discharge today and have her followup as scheduled with Dr. Magnus Ivan this coming Tuesday.  Consults: GI  Significant Diagnostic Studies: HIDA and ERCP/stent as above  Discharge Exam: Blood pressure 114/77, pulse 68, temperature 97.7 F (36.5 C), temperature source Oral, resp. rate 18, height 5\' 5"  (1.651 m), weight 67.994 kg (149 lb 14.4 oz), SpO2 100.00%. General appearance: alert and cooperative Cardio: regular rate and rhythm GI: soft, nontender, active BS Ambulating Disposition: 01-Home or Self Care  Discharge Orders   Future Appointments Provider Department Dept Phone   12/01/2012 11:20 AM Shelly Rubenstein, MD [facility], [place] [phone]   Future Orders Complete By Expires     Diet - low sodium heart healthy  As directed     Discharge instructions  As directed     Comments:      Continue post-op lifting restrictions.  Follow up with Dr. Magnus Ivan at [facility] 4/7 as scheduled.    Increase activity slowly  As directed         Medication List    TAKE these medications       estrogens (conjugated) 0.9 MG tablet  Commonly known  as:  PREMARIN  Take 0.9 mg by mouth daily. Take daily for 21 days then do not take for 7 days.     hydrochlorothiazide 12.5 MG tablet  Commonly known as:  HYDRODIURIL  Take 12.5 mg by mouth daily as needed (for fluid retention).     HYDROcodone-acetaminophen 5-325 MG per tablet  Commonly known as:  NORCO/VICODIN  Take 1 tablet by mouth every 4 (four) hours as needed for pain.     ondansetron 8 MG tablet  Commonly known as:  ZOFRAN  Take 1 tablet (8 mg total) by mouth 2 (two) times daily as needed for nausea.     simethicone 80 MG chewable tablet  Commonly known as:  MYLICON  Chew 1 tablet (80 mg total) by mouth 4 (four) times daily as needed (gas pains).     temazepam 15 MG capsule  Commonly known as:  RESTORIL  Take 15 mg by mouth at bedtime as needed for sleep.         SignedVioleta Gelinas E 11/28/2012, 10:03 AM

## 2012-11-28 NOTE — Progress Notes (Signed)
[facility] Progress Note  Subjective: Mild RUQ pain, feels much better, not very hungry  Objective:  Vital signs in last 24 hours: Temp:  [97.7 F (36.5 C)-98.9 F (37.2 C)] 97.7 F (36.5 C) (04/05 0649) Pulse Rate:  [61-74] 68 (04/05 0649) Resp:  [11-24] 18 (04/05 0649) BP: (102-153)/(52-94) 114/77 mmHg (04/05 0649) SpO2:  [94 %-100 %] 100 % (04/05 0649) Last BM Date: 11/26/12 General:   Alert, well-developed,  white female in NAD Heart:  Regular rate and rhythm; no murmurs Chest: CTA Abdomen:  Soft, mild RUQ tenderness and nondistended. Normal bowel sounds, without guarding, and without rebound.   Extremities:  Without edema. Neurologic:  Alert and  oriented x4;  grossly normal neurologically. Psych:  Alert and cooperative. Normal mood and affect.  Intake/Output from previous day: 04/04 0701 - 04/05 0700 In: 2941.7 [I.V.:2941.7] Out: -  Intake/Output this shift:    Lab Results:  Recent Labs  11/25/12 1525 11/25/12 2031  WBC 8.4 9.1  HGB 14.1 13.6  HCT 41.0 37.9  PLT 285 245   BMET  Recent Labs  11/25/12 1525 11/25/12 2031 11/27/12 0552  NA 138 134* 135  K 4.3 3.7 4.7  CL 101 101 103  CO2 25 24 27   GLUCOSE 92 102* 104*  BUN 14 13 9   CREATININE 0.83 0.66 0.75  CALCIUM 9.6 9.1 8.2*   LFT  Recent Labs  11/27/12 0552  PROT 6.1  ALBUMIN 2.8*  AST 21  ALT 24  ALKPHOS 105  BILITOT 1.0    Studies/Results: US Abdomen Complete  11/27/2012  *RADIOLOGY REPORT*  Clinical Data:  Prior cholecystectomy.  Periportal edema and possible biliary dilatation on CT.  ABDOMINAL ULTRASOUND COMPLETE  Comparison:  CT on 11/26/2012  Findings:  Gallbladder:  Surgically absent.  Common Bile Duct:  Common bile duct measures up to 8 mm in diameter, which is mildly dilated.  No definite evidence of intrahepatic biliary dilatation seen.  This could be related to prior cholecystectomy.  Liver: No focal mass lesion identified.  Within normal limits in parenchymal  echogenicity.  9 mm porta hepatis lymph node is noted, which is not pathologically enlarged.  IVC:  Appears normal.  Pancreas:  No abnormality identified.  Spleen:  Within normal limits in size and echotexture.  Right kidney:  Normal in size and parenchymal echogenicity.  No evidence of mass or hydronephrosis.  Left kidney:  Normal in size and parenchymal echogenicity.  No evidence of mass or hydronephrosis.  Abdominal Aorta:  No aneurysm identified.  IMPRESSION:  1.  Prior cholecystectomy. 2.  Mildly dilated common bile duct measuring 8 mm, however no definite evidence of intrahepatic ductal dilatation.  Recommend correlation with liver function tests; if abnormal, consider MRCP for further evaluation.   Original Report Authenticated By: Myles Rosenthal, M.D.    Ct Abdomen Pelvis W Contrast  11/26/2012  *RADIOLOGY REPORT*  Clinical Data: Postoperative abdominal pain. Status post cholecystectomy.  CT ABDOMEN AND PELVIS WITH CONTRAST  Technique:  Multidetector CT imaging of the abdomen and pelvis was performed following the standard protocol during bolus administration of intravenous contrast.  Contrast: 80mL OMNIPAQUE IOHEXOL 300 MG/ML  SOLN  Comparison: Abdominal ultrasound 10/06/2012. Report of CT abdomen pelvis without contrast 01/26/2010.  Findings: Mild dependent atelectasis is present bilaterally.  The lungs are otherwise clear. The heart size is normal.  No significant pleural or pericardial effusion is present.  Moderate periportal edema is present.  The common hepatic duct is dilated at the porta hepatis, above  the level of the cholecystectomy.  The patient is status post cholecystectomy. Minimal inflammatory changes reside in the cholecystectomy bed. The spleen is within normal limits.  Stomach, duodenum, and pancreas are normal.  The adrenal glands are normal bilaterally.  The kidneys are unremarkable.  The ureters and urinary bladder are within normal limits.  The rectosigmoid colon is within normal limits.   A small amount of free fluid is layering within the anatomic pelvis.  The remainder the colon is unremarkable.  The appendix is visualized and normal. Small bowel is within normal limits.  No significant adenopathy is present.  The bone windows are unremarkable.  IMPRESSION:  1.  Status post cholecystectomy. 2.  Moderate periportal edema and dilation of the common hepatic duct, above the surgical site.  This may represent focal obstruction.  Ultrasound may be useful for further evaluation. 3.  Minimal fluid layering in the anatomic pelvis likely reactive.   Original Report Authenticated By: Marin Roberts, M.D.    Dg Ercp Biliary & Pancreatic Ducts  11/27/2012  *RADIOLOGY REPORT*  Clinical Data: Periportal edema and biliary dilatation after prior cholecystectomy.  ERCP  Comparison:  None.  Technique:  Multiple spot images obtained with the fluoroscopic device and submitted for interpretation post-procedure.  ERCP was performed by Dr. Elnoria Howard.  Findings: Imaging during ERCP demonstrates normal caliber extrahepatic bile ducts. No filling defects or extravasation are identified.  A biliary stent was placed.  IMPRESSION: ERCP showing no overt abnormalities.  A common duct biliary stent was placed.  These images were submitted for radiologic interpretation only. Please see the procedural report for the amount of contrast and the fluoroscopy time utilized.   Original Report Authenticated By: Irish Lack, M.D.    Assessment / Plan:   1. RUQ pain, post cholecystectomy with periportal edema and mild biliary dilation on CT. ERCP with stent placement yesterday by Dr Elnoria Howard for possible bile leak that was not demonstrated on ERCP or HIDA.   Active Problems:   * No active hospital problems. *   LOS: 3 days   Jaxyn Rout T. Russella Dar MD [facility]  11/28/2012, 9:46 AM

## 2012-11-29 ENCOUNTER — Telehealth: Payer: Self-pay | Admitting: Gastroenterology

## 2012-11-29 NOTE — Telephone Encounter (Signed)
On call note @ 1100. Pt discharged from [facility] yesterday. I saw her yesterday on rounds. Still having sharp RUQ pain a few secondes after any PO intake. Pain overall is improved but persistent.  Advised a light diet. Omeprazole 40 mg po daily. Mylanta qid prn. Contact Dr. Elnoria Howard or Dr. Rayburn Ma tomorrow if not improved. Has appt with Dr. Rayburn Ma on Tuesday.

## 2012-11-30 ENCOUNTER — Telehealth (INDEPENDENT_AMBULATORY_CARE_PROVIDER_SITE_OTHER): Payer: Self-pay | Admitting: *Deleted

## 2012-11-30 ENCOUNTER — Encounter (HOSPITAL_COMMUNITY): Payer: Self-pay | Admitting: Gastroenterology

## 2012-11-30 NOTE — Telephone Encounter (Signed)
Patient called this morning stating that she is still have severe pain at the site of her incision.  Patient states the pain medication is not helping at all at this time.  This RN found documentation in Epic where patient spoke to Dr. Russella Dar regarding this same issue yesterday.  Patient has an appt tomorrow to see Dr. Magnus Ivan.  Any other suggestions?   Call Documentation    Meryl Dare, MD at 11/29/2012 11:05 AM    Status: Signed             On call note @ 1100. Pt discharged from [facility] yesterday. I saw her yesterday on rounds. Still having sharp RUQ pain a few secondes after any PO intake. Pain overall is improved but persistent.  Advised a light diet. Omeprazole 40 mg po daily. Mylanta qid prn. Contact Dr. Elnoria Howard or Dr. Rayburn Ma tomorrow if not improved. Has appt with Dr. Rayburn Ma on Tuesday.

## 2012-11-30 NOTE — Telephone Encounter (Signed)
Called patient back and talked to her to see if she wanted a different pain med and patient stated no that she was ok with the pain med that she has now, she stated that she is in pain with she eat and drink anything. She stated that when she had her ERCP done by Dr Elnoria Howard that it hurt so I told her that she need to call Dr Elnoria Howard office and talk to him and his nurse, she is going to keep her apt with Dr Magnus Ivan tomorrow 12-01-12

## 2012-12-01 ENCOUNTER — Ambulatory Visit (INDEPENDENT_AMBULATORY_CARE_PROVIDER_SITE_OTHER): Payer: PRIVATE HEALTH INSURANCE | Admitting: Surgery

## 2012-12-01 ENCOUNTER — Encounter (INDEPENDENT_AMBULATORY_CARE_PROVIDER_SITE_OTHER): Payer: Self-pay | Admitting: Surgery

## 2012-12-01 VITALS — BP 102/70 | HR 60 | Temp 98.6°F | Resp 12 | Ht 64.0 in | Wt 158.2 lb

## 2012-12-01 DIAGNOSIS — Z09 Encounter for follow-up examination after completed treatment for conditions other than malignant neoplasm: Secondary | ICD-10-CM

## 2012-12-01 NOTE — Progress Notes (Signed)
Subjective:     Patient ID: Jessica Robinson, female   DOB: 1971/01/16, 42 y.o.   MRN: [id number]  HPI She is here for a hospital in postop followup. She is still having some right upper quadrant abdominal discomfort which sounds like it is in the abdominal wall. It is a sharp intermittent pain. She has no nausea or vomiting and is moving her bowels well  Review of Systems     Objective:   Physical Exam On exam, her abdomen is soft and nontender. She is well In appearance     Assessment:     Patient status post laparoscopic cholecystectomy with findings of chronic cholecystitis complicated with postoperative pain. She had an ERCP with stent placement for suspected bile leak although none was visualized     Plan:     She will continue her anti-inflammatory. I will see her back in 2 weeks

## 2012-12-02 ENCOUNTER — Other Ambulatory Visit (INDEPENDENT_AMBULATORY_CARE_PROVIDER_SITE_OTHER): Payer: Self-pay

## 2012-12-02 ENCOUNTER — Telehealth (INDEPENDENT_AMBULATORY_CARE_PROVIDER_SITE_OTHER): Payer: Self-pay

## 2012-12-02 DIAGNOSIS — R109 Unspecified abdominal pain: Secondary | ICD-10-CM

## 2012-12-02 DIAGNOSIS — Z9049 Acquired absence of other specified parts of digestive tract: Secondary | ICD-10-CM

## 2012-12-02 MED ORDER — CYCLOBENZAPRINE HCL 5 MG PO TABS
ORAL_TABLET | ORAL | Status: DC
Start: 2012-12-02 — End: 2016-08-20

## 2012-12-02 NOTE — Telephone Encounter (Signed)
Call in flexeril 5mg  1 - 2 po tid #50 with 1 refill  It will be later in the day before I can call her husband

## 2012-12-02 NOTE — Telephone Encounter (Signed)
Patient spouse Gabriel Rung) calling into the office to report an increase in abdominal discomfort for patient.  Reports that she's currently taking pain medication but, was calling to see if there's something to prescribe for muscle spasms.  Patient aware that we will forward the message to Dr. Magnus Ivan for review.  Patient's spouse Gabriel Rung) ask that we call him at [phone].  Patient pharmacy is [facility] on [address] in [place]

## 2012-12-02 NOTE — Telephone Encounter (Signed)
Called patient husband Gabriel Rung) and made him aware that Flexril 5 mg has been called in to [facility] on [address] in [place].  States he just spoke to Dr. Magnus Ivan.

## 2012-12-02 NOTE — Telephone Encounter (Signed)
I'll call patient husband, he just ask that we call him if you decided to call in a medication for her.  I'll call in Flexeril for patient and make sure they are aware.

## 2012-12-03 ENCOUNTER — Encounter (INDEPENDENT_AMBULATORY_CARE_PROVIDER_SITE_OTHER): Payer: Self-pay

## 2012-12-10 ENCOUNTER — Other Ambulatory Visit: Payer: Self-pay | Admitting: Gastroenterology

## 2012-12-10 DIAGNOSIS — R1033 Periumbilical pain: Secondary | ICD-10-CM

## 2012-12-10 DIAGNOSIS — R110 Nausea: Secondary | ICD-10-CM

## 2012-12-15 ENCOUNTER — Other Ambulatory Visit: Payer: PRIVATE HEALTH INSURANCE

## 2012-12-15 NOTE — Progress Notes (Signed)
Utilization Review Completed.   Jaimeson Gopal, RN, BSN Nurse Case Manager  [phone]  

## 2012-12-18 ENCOUNTER — Encounter (INDEPENDENT_AMBULATORY_CARE_PROVIDER_SITE_OTHER): Payer: PRIVATE HEALTH INSURANCE | Admitting: Surgery

## 2013-01-07 ENCOUNTER — Encounter (INDEPENDENT_AMBULATORY_CARE_PROVIDER_SITE_OTHER): Payer: Self-pay | Admitting: Surgery

## 2013-02-12 ENCOUNTER — Other Ambulatory Visit: Payer: Self-pay | Admitting: Obstetrics and Gynecology

## 2013-02-12 DIAGNOSIS — N644 Mastodynia: Secondary | ICD-10-CM

## 2013-02-24 ENCOUNTER — Ambulatory Visit
Admission: RE | Admit: 2013-02-24 | Discharge: 2013-02-24 | Disposition: A | Discharge status: Home / Self Care | Payer: PRIVATE HEALTH INSURANCE | Source: Ambulatory Visit | Attending: Obstetrics and Gynecology | Admitting: Obstetrics and Gynecology

## 2013-02-24 DIAGNOSIS — N644 Mastodynia: Secondary | ICD-10-CM

## 2014-01-04 DIAGNOSIS — F4322 Adjustment disorder with anxiety: Secondary | ICD-10-CM

## 2014-01-04 DIAGNOSIS — G4700 Insomnia, unspecified: Secondary | ICD-10-CM | POA: Insufficient documentation

## 2014-01-04 HISTORY — DX: Insomnia, unspecified: G47.00

## 2014-01-04 HISTORY — DX: Adjustment disorder with anxiety: F43.22

## 2014-01-19 ENCOUNTER — Encounter (INDEPENDENT_AMBULATORY_CARE_PROVIDER_SITE_OTHER): Payer: Self-pay | Admitting: Surgery

## 2014-01-24 DIAGNOSIS — S83242A Other tear of medial meniscus, current injury, left knee, initial encounter: Secondary | ICD-10-CM

## 2014-01-24 HISTORY — DX: Other tear of medial meniscus, current injury, left knee, initial encounter: S83.242A

## 2014-02-04 ENCOUNTER — Encounter (INDEPENDENT_AMBULATORY_CARE_PROVIDER_SITE_OTHER): Payer: PRIVATE HEALTH INSURANCE | Admitting: Surgery

## 2014-02-04 ENCOUNTER — Ambulatory Visit (INDEPENDENT_AMBULATORY_CARE_PROVIDER_SITE_OTHER): Payer: PRIVATE HEALTH INSURANCE | Admitting: Surgery

## 2014-02-04 DIAGNOSIS — D179 Benign lipomatous neoplasm, unspecified: Secondary | ICD-10-CM

## 2014-02-04 NOTE — Progress Notes (Signed)
Subjective:     Patient ID: Jessica Robinson, female   DOB: 02-21-1971, 43 y.o.   MRN: [id number]  HPI This is a very pleasant patient of mine who is status post a laparoscopic cholecystectomy a year ago. 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 It is now getting larger and causing her to have discomfort.  Review of Systems     Objective:   Physical Exam On exam, there is a 2 cm to 3 cm of left flank lipoma which is tender as it sits on top of her rib    Assessment:     Right flank lipoma     Plan:     Because it is becoming larger, is creating pain, and to rule out malignancy, removal of the lipomas recommended. She is scheduled to have surgery on a torn meniscus in her knee. When she is recovered she will call back and we will schedule her for removal of the lipoma

## 2014-03-15 HISTORY — PX: KNEE ARTHROSCOPY: SHX127

## 2014-04-27 ENCOUNTER — Other Ambulatory Visit: Payer: Self-pay

## 2014-04-27 DIAGNOSIS — Z1231 Encounter for screening mammogram for malignant neoplasm of breast: Secondary | ICD-10-CM

## 2014-05-12 ENCOUNTER — Ambulatory Visit
Admission: RE | Admit: 2014-05-12 | Discharge: 2014-05-12 | Disposition: A | Discharge status: Home / Self Care | Payer: PRIVATE HEALTH INSURANCE | Source: Ambulatory Visit

## 2014-05-12 DIAGNOSIS — Z1231 Encounter for screening mammogram for malignant neoplasm of breast: Secondary | ICD-10-CM

## 2014-06-15 DIAGNOSIS — IMO0002 Reserved for concepts with insufficient information to code with codable children: Secondary | ICD-10-CM | POA: Insufficient documentation

## 2014-06-15 DIAGNOSIS — M7072 Other bursitis of hip, left hip: Secondary | ICD-10-CM

## 2014-06-15 HISTORY — DX: Reserved for concepts with insufficient information to code with codable children: IMO0002

## 2014-06-15 HISTORY — DX: Other bursitis of hip, left hip: M70.72

## 2014-06-18 DIAGNOSIS — G44229 Chronic tension-type headache, not intractable: Secondary | ICD-10-CM

## 2014-06-18 DIAGNOSIS — K5790 Diverticulosis of intestine, part unspecified, without perforation or abscess without bleeding: Secondary | ICD-10-CM

## 2014-06-18 DIAGNOSIS — E663 Overweight: Secondary | ICD-10-CM | POA: Insufficient documentation

## 2014-06-18 DIAGNOSIS — F419 Anxiety disorder, unspecified: Secondary | ICD-10-CM | POA: Insufficient documentation

## 2014-06-18 DIAGNOSIS — G8929 Other chronic pain: Secondary | ICD-10-CM | POA: Insufficient documentation

## 2014-06-18 DIAGNOSIS — N951 Menopausal and female climacteric states: Secondary | ICD-10-CM

## 2014-06-18 DIAGNOSIS — M545 Low back pain, unspecified: Secondary | ICD-10-CM

## 2014-06-18 DIAGNOSIS — K589 Irritable bowel syndrome without diarrhea: Secondary | ICD-10-CM

## 2014-06-18 DIAGNOSIS — IMO0002 Reserved for concepts with insufficient information to code with codable children: Secondary | ICD-10-CM | POA: Insufficient documentation

## 2014-06-18 HISTORY — DX: Chronic tension-type headache, not intractable: G44.229

## 2014-06-18 HISTORY — DX: Menopausal and female climacteric states: N95.1

## 2014-06-18 HISTORY — DX: Low back pain, unspecified: M54.50

## 2014-06-18 HISTORY — DX: Reserved for concepts with insufficient information to code with codable children: IMO0002

## 2014-06-18 HISTORY — DX: Overweight: E66.3

## 2014-06-18 HISTORY — DX: Irritable bowel syndrome without diarrhea: K58.9

## 2014-06-18 HISTORY — DX: Other chronic pain: G89.29

## 2014-06-18 HISTORY — DX: Diverticulosis of intestine, part unspecified, without perforation or abscess without bleeding: K57.90

## 2014-06-18 HISTORY — DX: Anxiety disorder, unspecified: F41.9

## 2014-07-05 IMAGING — US US ABDOMEN COMPLETE
1 series · 13 of 25 positions shown · non-contrast
Comparison: CT on 11/26/2012

CLINICAL DATA: Prior cholecystectomy.  Periportal edema and
possible biliary dilatation on CT.

ABDOMINAL ULTRASOUND COMPLETE

[Series 1: us abdomen complete · 0.31mm/px · 65 acquisitions, 13 frames shown]
[im 1/65]
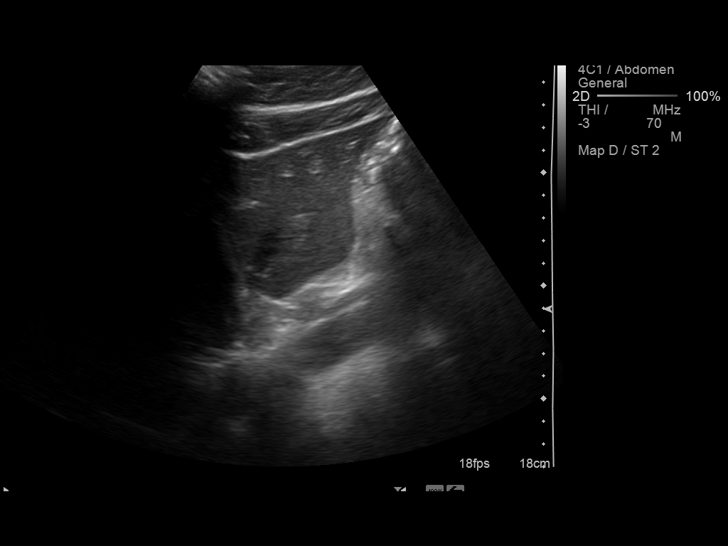
[im 6/65]
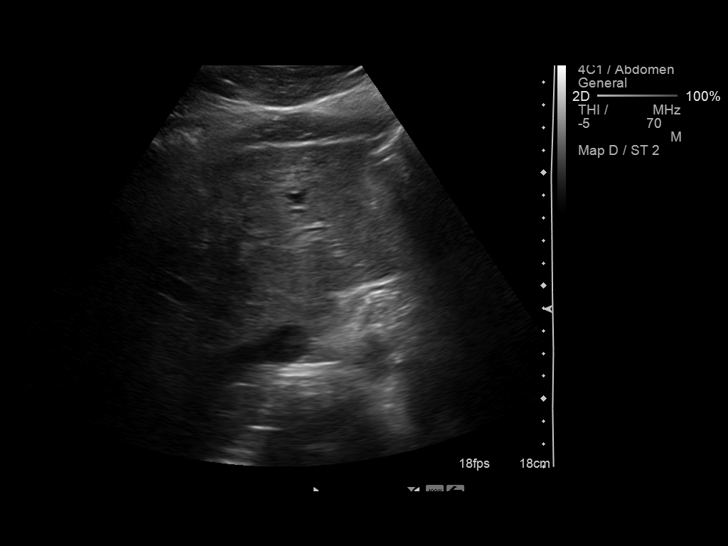
[im 11/65]
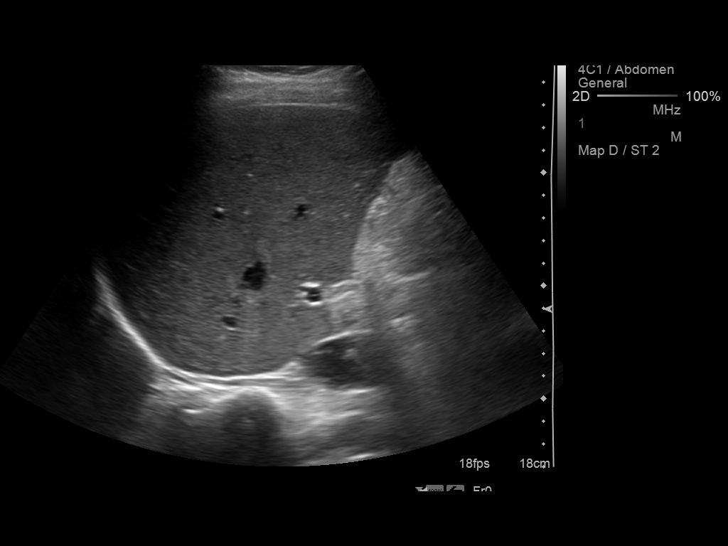
[im 17/65]
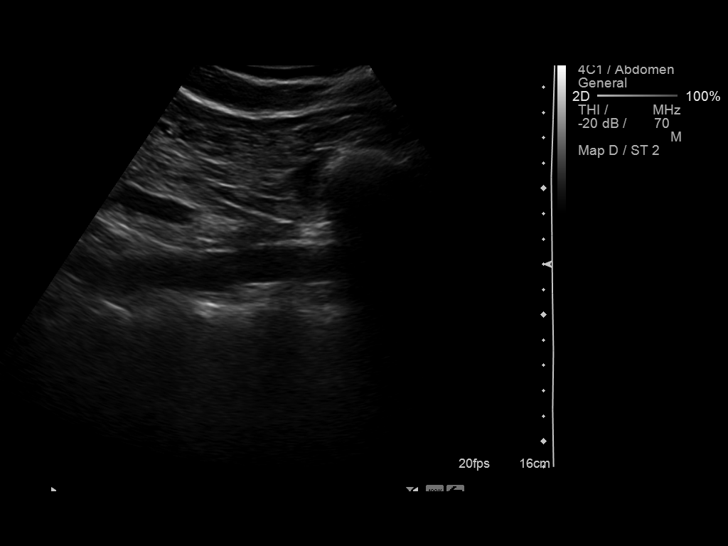
[im 22/65]
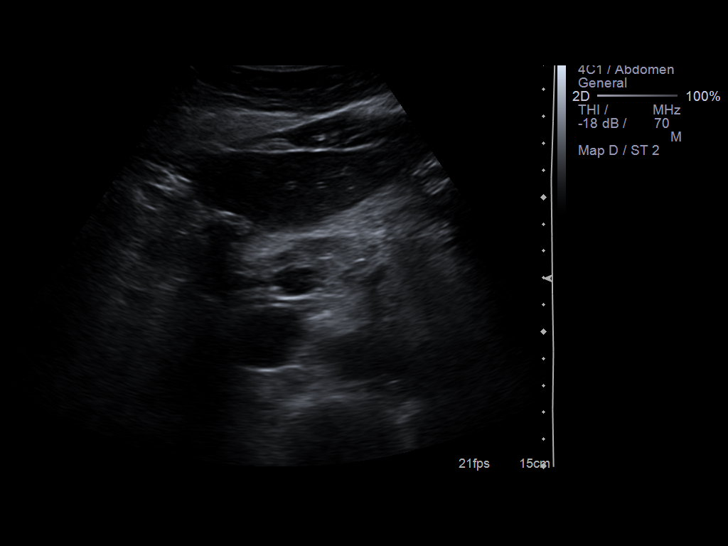
[im 27/65]
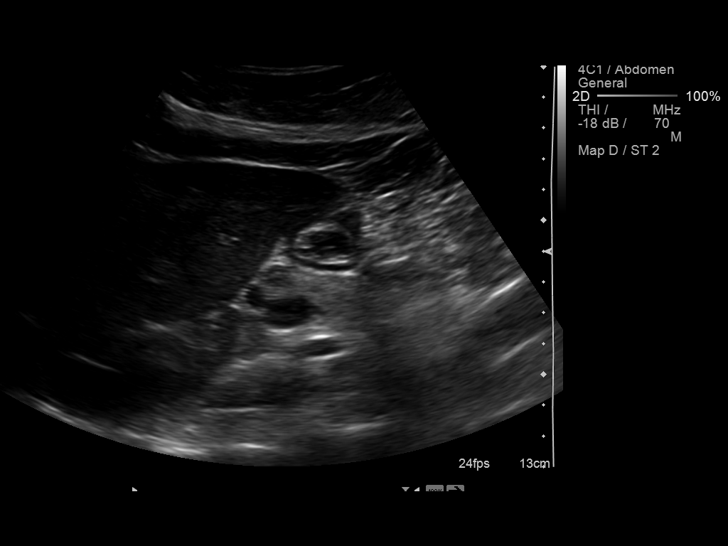
[im 33/65]
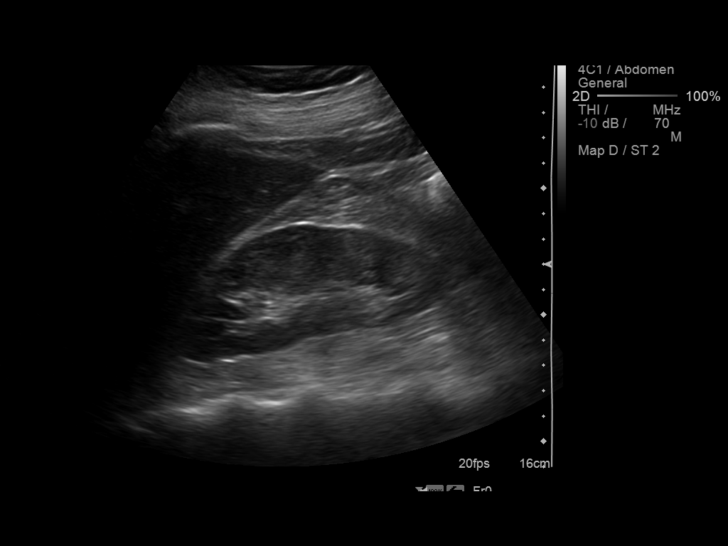
[im 38/65]
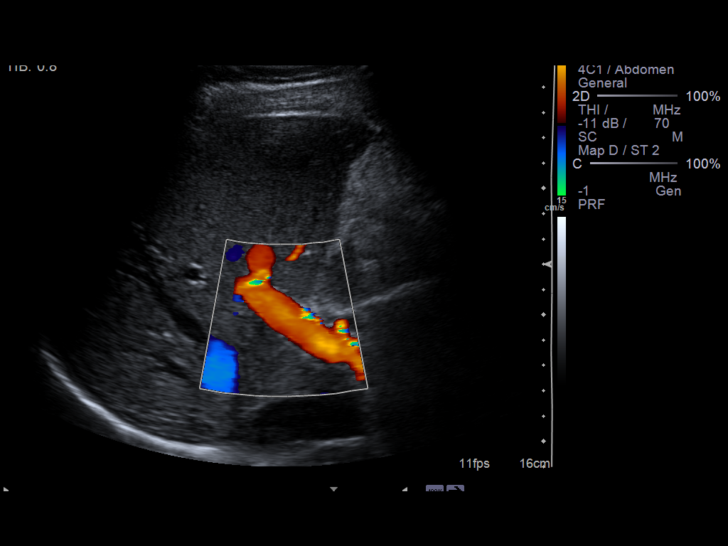
[im 43/65]
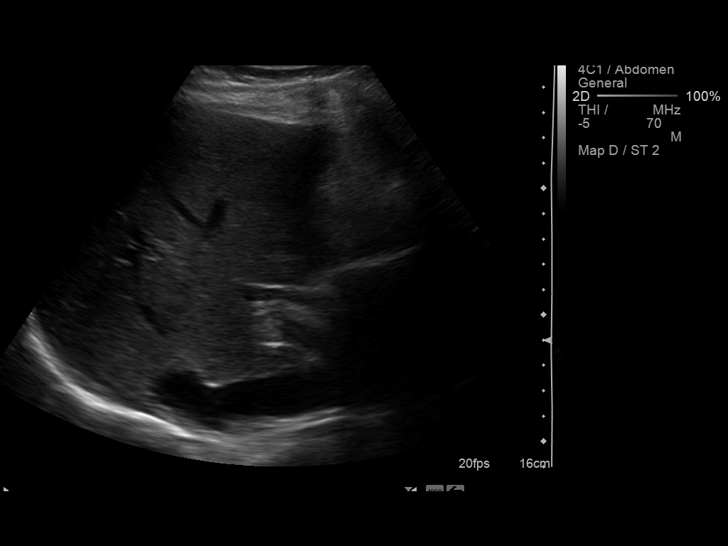
[im 49/65]
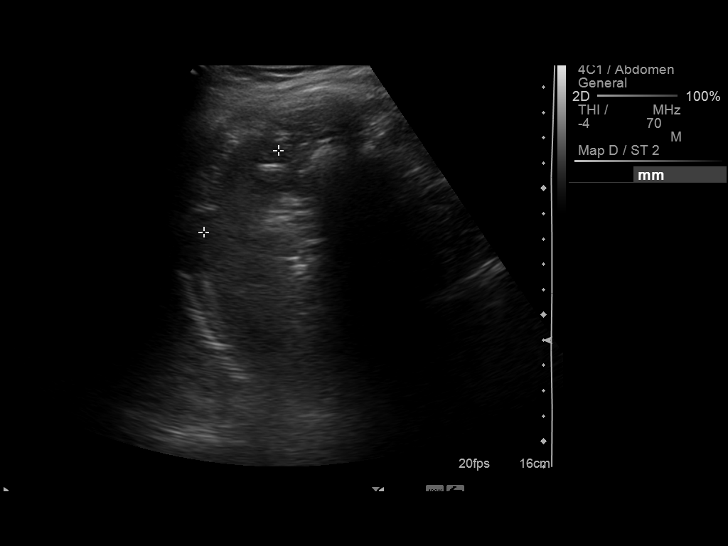
[im 54/65]
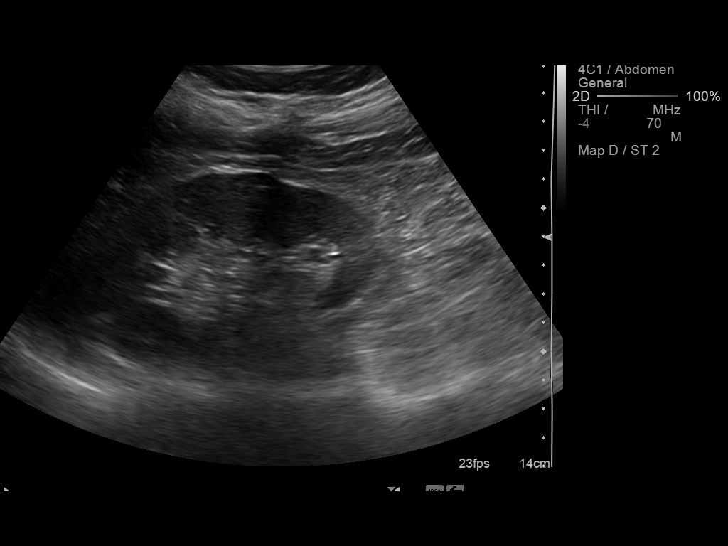
[im 59/65]
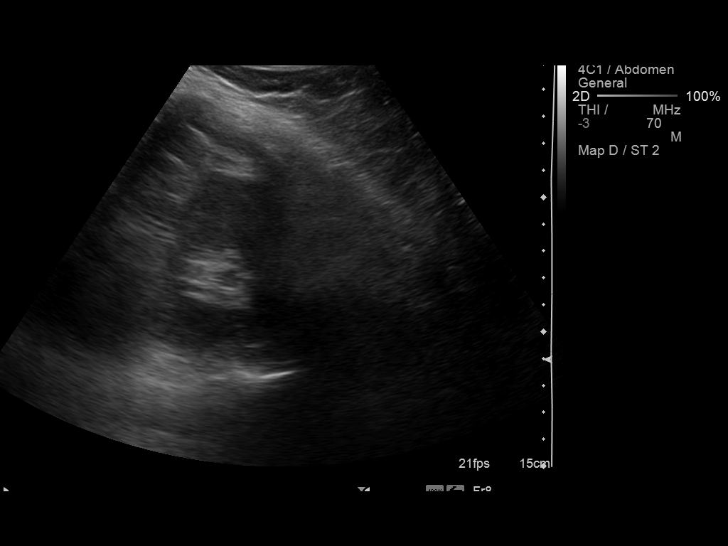
[im 65/65]
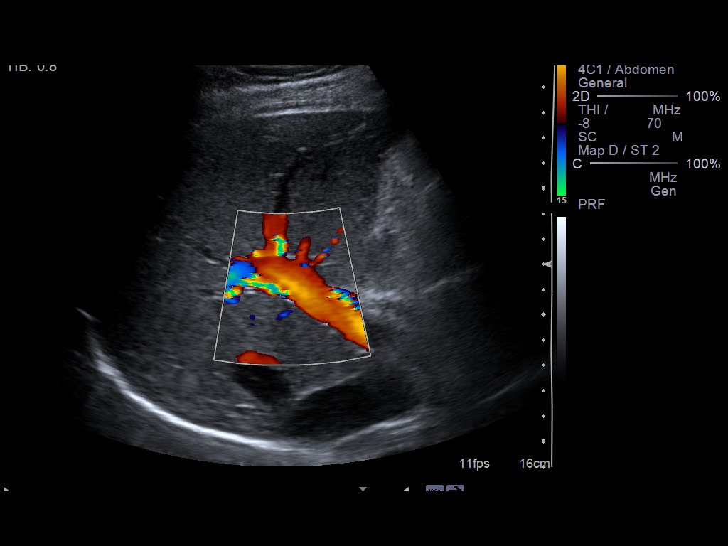

[13 of 25 positions shown; findings below may reference images not displayed]

FINDINGS: Gallbladder:  Surgically absent.

Common Bile Duct:  Common bile duct measures up to 8 mm in
diameter, which is mildly dilated.  No definite evidence of
intrahepatic biliary dilatation seen.  This could be related to
prior cholecystectomy.

Liver: No focal mass lesion identified.  Within normal limits in
parenchymal echogenicity.  9 mm porta hepatis lymph node is noted,
which is not pathologically enlarged.

IVC:  Appears normal.

Pancreas:  No abnormality identified.

Spleen:  Within normal limits in size and echotexture.

Right kidney:  Normal in size and parenchymal echogenicity.  No
evidence of mass or hydronephrosis.

Left kidney:  Normal in size and parenchymal echogenicity.  No
evidence of mass or hydronephrosis.

Abdominal Aorta:  No aneurysm identified.
IMPRESSION: 1.  Prior cholecystectomy.
2.  Mildly dilated common bile duct measuring 8 mm, however no
definite evidence of intrahepatic ductal dilatation.  Recommend
correlation with liver function tests; if abnormal, consider MRCP
for further evaluation.

## 2014-07-05 IMAGING — RF DG ERCP WO/W SPHINCTEROTOMY
1 series · 3 of 3 positions shown · non-contrast
Comparison: None.

CLINICAL DATA: Periportal edema and biliary dilatation after prior
cholecystectomy.

ERCP
TECHNIQUE: Multiple spot images obtained with the fluoroscopic
device and submitted for interpretation post-procedure.  ERCP was
performed by Ceejay. Maryorihe.

[Series 1: run · 3 of 3 slices shown]
[im 1/3]
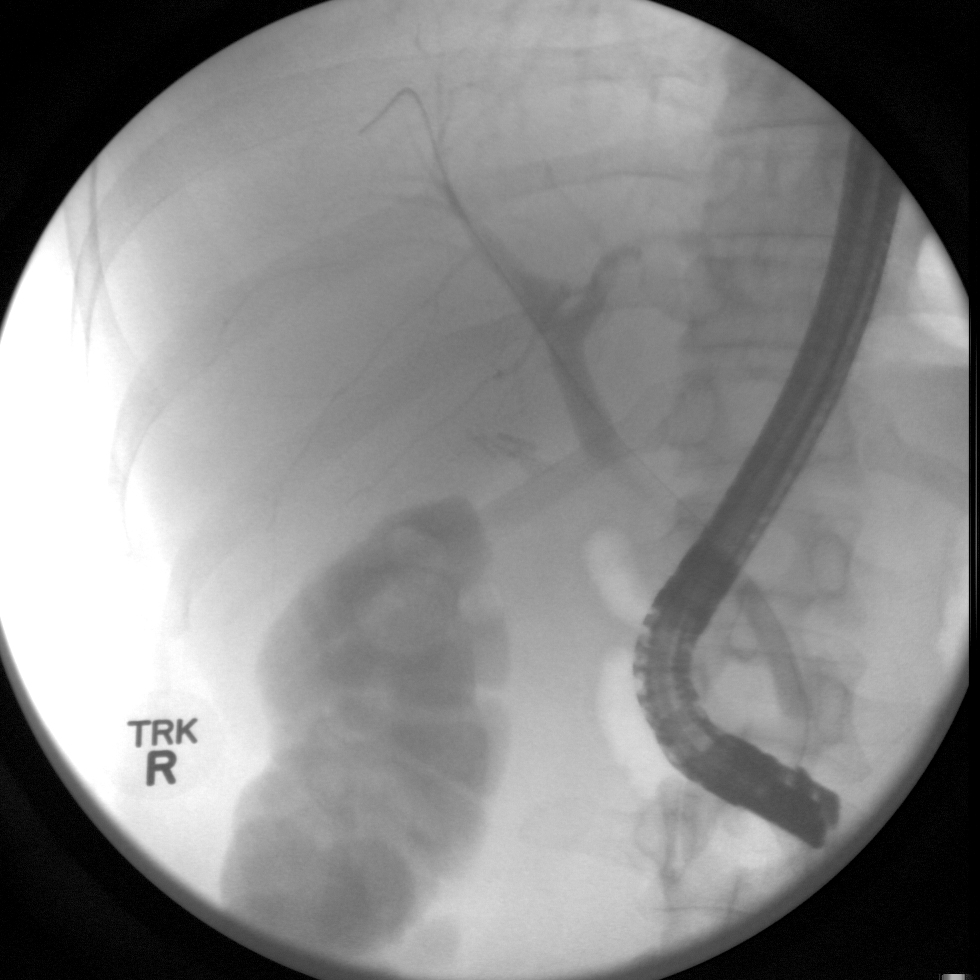
[im 2/3]
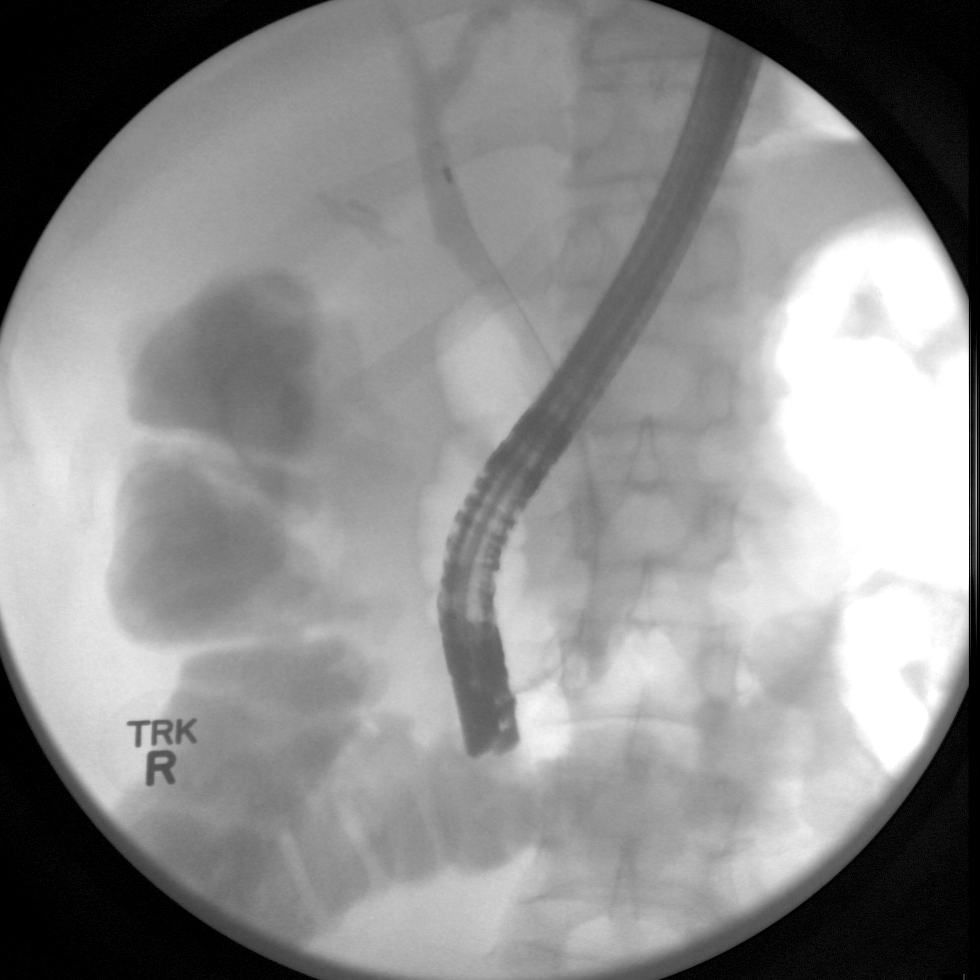
[im 3/3]
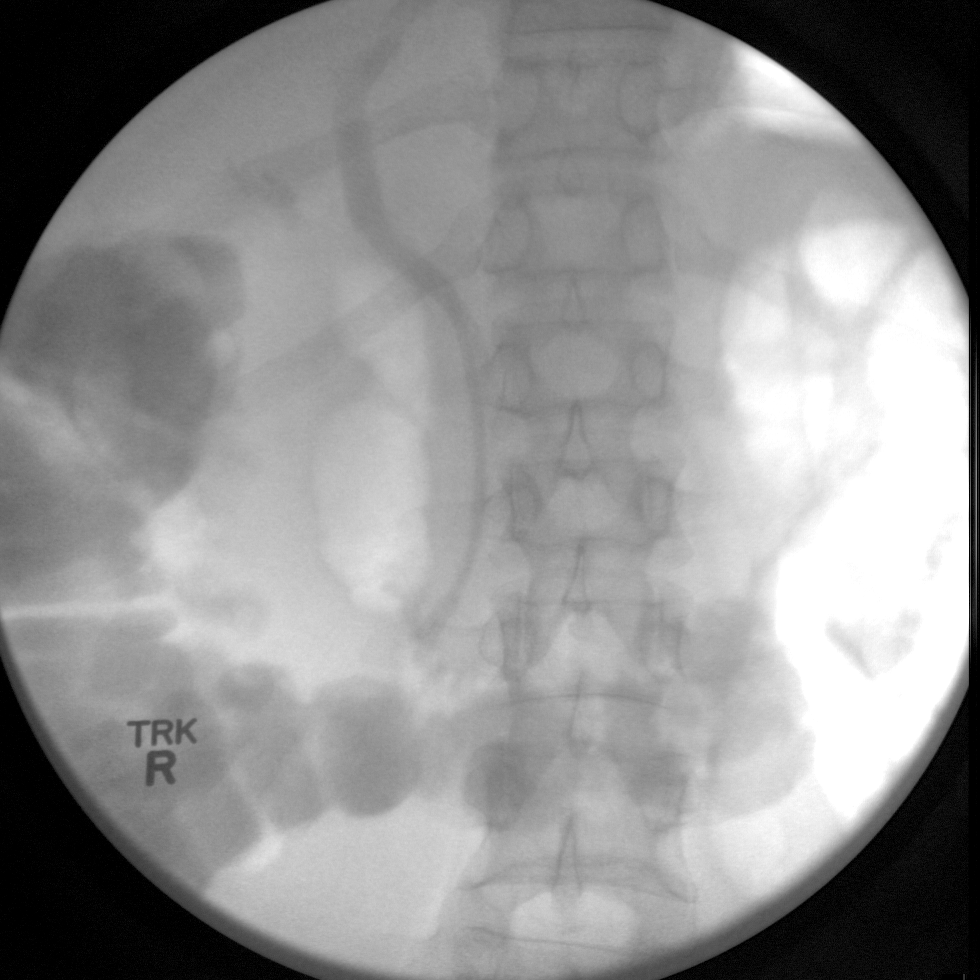

[3 of 3 positions shown; findings below may reference images not displayed]

FINDINGS: Imaging during ERCP demonstrates normal caliber
extrahepatic bile ducts. No filling defects or extravasation are
identified.  A biliary stent was placed.
IMPRESSION: ERCP showing no overt abnormalities.  A common duct biliary stent
was placed.

These images were submitted for radiologic interpretation only.
Please see the procedural report for the amount of contrast and the
fluoroscopy time utilized.

## 2014-08-23 HISTORY — PX: HIP ARTHROSCOPY: SHX668

## 2015-03-02 ENCOUNTER — Other Ambulatory Visit: Payer: Self-pay

## 2015-03-02 DIAGNOSIS — Z1231 Encounter for screening mammogram for malignant neoplasm of breast: Secondary | ICD-10-CM

## 2015-05-15 ENCOUNTER — Ambulatory Visit
Admission: RE | Admit: 2015-05-15 | Discharge: 2015-05-15 | Disposition: A | Discharge status: Home / Self Care | Payer: PRIVATE HEALTH INSURANCE | Source: Ambulatory Visit

## 2015-05-15 DIAGNOSIS — Z1231 Encounter for screening mammogram for malignant neoplasm of breast: Secondary | ICD-10-CM

## 2015-06-30 DIAGNOSIS — E538 Deficiency of other specified B group vitamins: Secondary | ICD-10-CM

## 2015-06-30 DIAGNOSIS — N951 Menopausal and female climacteric states: Secondary | ICD-10-CM

## 2015-06-30 HISTORY — DX: Deficiency of other specified B group vitamins: E53.8

## 2015-06-30 HISTORY — DX: Menopausal and female climacteric states: N95.1

## 2015-10-20 ENCOUNTER — Other Ambulatory Visit: Payer: Self-pay | Admitting: Orthopaedic Surgery

## 2015-10-20 DIAGNOSIS — M25511 Pain in right shoulder: Secondary | ICD-10-CM

## 2015-10-31 ENCOUNTER — Ambulatory Visit
Admission: RE | Admit: 2015-10-31 | Discharge: 2015-10-31 | Disposition: A | Discharge status: Home / Self Care | Payer: PRIVATE HEALTH INSURANCE | Source: Ambulatory Visit | Attending: Orthopaedic Surgery | Admitting: Orthopaedic Surgery

## 2015-10-31 DIAGNOSIS — M25511 Pain in right shoulder: Secondary | ICD-10-CM

## 2016-05-06 DIAGNOSIS — F988 Other specified behavioral and emotional disorders with onset usually occurring in childhood and adolescence: Secondary | ICD-10-CM | POA: Insufficient documentation

## 2016-05-06 HISTORY — DX: Other specified behavioral and emotional disorders with onset usually occurring in childhood and adolescence: F98.8

## 2016-06-26 DIAGNOSIS — R1084 Generalized abdominal pain: Secondary | ICD-10-CM | POA: Insufficient documentation

## 2016-06-26 HISTORY — DX: Generalized abdominal pain: R10.84

## 2016-07-10 ENCOUNTER — Other Ambulatory Visit: Payer: Self-pay | Admitting: Surgery

## 2016-07-26 DIAGNOSIS — R1900 Intra-abdominal and pelvic swelling, mass and lump, unspecified site: Secondary | ICD-10-CM | POA: Insufficient documentation

## 2016-07-26 HISTORY — DX: Intra-abdominal and pelvic swelling, mass and lump, unspecified site: R19.00

## 2016-08-02 ENCOUNTER — Other Ambulatory Visit: Payer: Self-pay | Admitting: Surgery

## 2016-08-20 ENCOUNTER — Encounter (HOSPITAL_BASED_OUTPATIENT_CLINIC_OR_DEPARTMENT_OTHER): Payer: Self-pay | Admitting: *Deleted

## 2016-08-22 NOTE — H&P (Signed)
  Jessica Robinson 08/02/2016 10:06 AM Location: [facility] Patient #: [id number] DOB: [birth year] Married / Language: Jessica Robinson / Race: White Female   History of Present Illness (Jessica Robinson A. Ninfa Linden MD; 08/02/2016 10:28 AM) The patient is a 45 year old female who presents with a complaint of Mass. She is here for follow-up of her right flank/abdominal wall mass. She reports that she is still having discomfort in this area is going on for almost 2 years. I reviewed her CT scan and there was no abnormality identified although they cannot rule out that there is a lipoma in this area  Other Problems Diverticulosis   Past Surgical History Breast Augmentation  Bilateral. Foot Surgery  Right. Gallbladder Surgery - Laparoscopic  Hip Surgery  Left. Knee Surgery  Right. Shoulder Surgery  Right.  Diagnostic Studies History Colonoscopy  within last year Mammogram  within last year Pap Smear  1-5 years ago   Allergies Jessica Robinson, CMA; 08/02/2016 10:07 AM) Macrolides and Ketolides  PROMETHAZINE  Itching. Adhesive Tape *MEDICAL DEVICES AND SUPPLIES*  ERYTHROMYCIN   Medication History Jessica Robinson, CMA; 08/02/2016 10:07 AM) LORazepam (1MG  Tablet, Oral) Active. Premarin (0.625MG  Tablet, Oral) Active. HydroCHLOROthiazide (12.5MG  Capsule, Oral) Active. Temazepam (15MG  Capsule, Oral) Active. Medications Reconciled  Vitals Jessica Robinson CMA; 08/02/2016 10:07 AM) 08/02/2016 10:07 AM Weight: 176 lb Height: 65in Body Surface Area: 1.87 m Body Mass Index: 29.29 kg/m  Temp.: 97.52F  Pulse: 62 (Regular)  BP: 116/78 (Sitting, Left Arm, Standard)   Physical Exam (Jessica Robinson A. Ninfa Linden MD; 08/02/2016 10:28 AM) The physical exam findings are as follows: Note:On examination, there is a vague almost 3 cm mass on the right flank at the rib margin which is painful. There is no erythema and no skin dimpling Lungs clear CV RRR Abdomen soft, non  tender    Assessment & Plan (Jessica Robinson A. Ninfa Linden MD; 08/02/2016 10:29 AM) ABDOMINAL WALL MASS OF RIGHT FLANK (R19.00) Impression: This is a flank mass of uncertain etiology. 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 She believes it is been increasing in size over the past 2 years. Because of this, I believe surgical excision is recommended. I discussed risks of surgery which includes but is not limited to bleeding, infection, injury to surrounding structures, recurrence, the chances may not resolve all her pain, etc. She understands and wishes to proceed with surgery.

## 2016-08-23 ENCOUNTER — Ambulatory Visit (HOSPITAL_BASED_OUTPATIENT_CLINIC_OR_DEPARTMENT_OTHER): Payer: PRIVATE HEALTH INSURANCE | Admitting: Certified Registered"

## 2016-08-23 ENCOUNTER — Encounter (HOSPITAL_BASED_OUTPATIENT_CLINIC_OR_DEPARTMENT_OTHER): Payer: Self-pay | Admitting: *Deleted

## 2016-08-23 ENCOUNTER — Ambulatory Visit (HOSPITAL_BASED_OUTPATIENT_CLINIC_OR_DEPARTMENT_OTHER)
Admission: RE | Admit: 2016-08-23 | Discharge: 2016-08-23 | Disposition: A | Discharge status: Home / Self Care | Payer: PRIVATE HEALTH INSURANCE | Source: Ambulatory Visit | Attending: Surgery | Admitting: Surgery

## 2016-08-23 ENCOUNTER — Encounter (HOSPITAL_BASED_OUTPATIENT_CLINIC_OR_DEPARTMENT_OTHER)
Admission: RE | Disposition: A | Discharge status: Home / Self Care | Payer: Self-pay | Source: Ambulatory Visit | Attending: Surgery

## 2016-08-23 DIAGNOSIS — Z79899 Other long term (current) drug therapy: Secondary | ICD-10-CM | POA: Insufficient documentation

## 2016-08-23 DIAGNOSIS — D171 Benign lipomatous neoplasm of skin and subcutaneous tissue of trunk: Secondary | ICD-10-CM | POA: Diagnosis not present

## 2016-08-23 DIAGNOSIS — Z79890 Hormone replacement therapy (postmenopausal): Secondary | ICD-10-CM | POA: Diagnosis not present

## 2016-08-23 DIAGNOSIS — R1900 Intra-abdominal and pelvic swelling, mass and lump, unspecified site: Secondary | ICD-10-CM | POA: Diagnosis present

## 2016-08-23 HISTORY — DX: Other specified postprocedural states: Z98.890

## 2016-08-23 HISTORY — DX: Intra-abdominal and pelvic swelling, mass and lump, unspecified site: R19.00

## 2016-08-23 HISTORY — PX: WOUND EXPLORATION: SHX6188

## 2016-08-23 HISTORY — DX: Nausea with vomiting, unspecified: R11.2

## 2016-08-23 HISTORY — DX: Personal history of other diseases of the nervous system and sense organs: Z86.69

## 2016-08-23 SURGERY — WOUND EXPLORATION
Anesthesia: General | Site: Abdomen | Laterality: Right

## 2016-08-23 MED ORDER — MIDAZOLAM HCL 2 MG/2ML IJ SOLN: 1.0000 mg | INTRAMUSCULAR | Status: DC | PRN

## 2016-08-23 MED ORDER — ONDANSETRON HCL 4 MG/2ML IJ SOLN
INTRAMUSCULAR | Status: DC | PRN
Start: 2016-08-23 — End: 2016-08-23
  Administered 2016-08-23: 4 mg via INTRAVENOUS

## 2016-08-23 MED ORDER — BUPIVACAINE HCL (PF) 0.5 % IJ SOLN
INTRAMUSCULAR | Status: DC | PRN
  Administered 2016-08-23: 20 mL

## 2016-08-23 MED ORDER — DEXAMETHASONE SODIUM PHOSPHATE 4 MG/ML IJ SOLN
INTRAMUSCULAR | Status: DC | PRN
  Administered 2016-08-23: 10 mg via INTRAVENOUS

## 2016-08-23 MED ORDER — MIDAZOLAM HCL 2 MG/2ML IJ SOLN
INTRAMUSCULAR | Status: AC
  Filled 2016-08-23: qty 2

## 2016-08-23 MED ORDER — HYDROCODONE-ACETAMINOPHEN 7.5-325 MG PO TABS: 1.0000 | ORAL_TABLET | Freq: Once | ORAL | Status: DC | PRN

## 2016-08-23 MED ORDER — LIDOCAINE 2% (20 MG/ML) 5 ML SYRINGE
INTRAMUSCULAR | Status: AC
  Filled 2016-08-23: qty 5

## 2016-08-23 MED ORDER — OXYCODONE-ACETAMINOPHEN 5-325 MG PO TABS: 1.0000 | ORAL_TABLET | ORAL | 0 refills | Status: DC | PRN

## 2016-08-23 MED ORDER — ONDANSETRON HCL 4 MG/2ML IJ SOLN
INTRAMUSCULAR | Status: AC
  Filled 2016-08-23: qty 2

## 2016-08-23 MED ORDER — LIDOCAINE HCL (CARDIAC) 20 MG/ML IV SOLN
INTRAVENOUS | Status: DC | PRN
  Administered 2016-08-23: 60 mg via INTRAVENOUS

## 2016-08-23 MED ORDER — PROMETHAZINE HCL 25 MG/ML IJ SOLN: 6.2500 mg | INTRAMUSCULAR | Status: DC | PRN

## 2016-08-23 MED ORDER — HYDROMORPHONE HCL 1 MG/ML IJ SOLN
0.2500 mg | INTRAMUSCULAR | Status: DC | PRN
  Administered 2016-08-23: 0.5 mg via INTRAVENOUS

## 2016-08-23 MED ORDER — HYDROMORPHONE HCL 1 MG/ML IJ SOLN
INTRAMUSCULAR | Status: AC
  Filled 2016-08-23: qty 1

## 2016-08-23 MED ORDER — FENTANYL CITRATE (PF) 100 MCG/2ML IJ SOLN
INTRAMUSCULAR | Status: AC
  Filled 2016-08-23: qty 2

## 2016-08-23 MED ORDER — DEXAMETHASONE SODIUM PHOSPHATE 10 MG/ML IJ SOLN
INTRAMUSCULAR | Status: AC
  Filled 2016-08-23: qty 1

## 2016-08-23 MED ORDER — SCOPOLAMINE 1 MG/3DAYS TD PT72
1.0000 | MEDICATED_PATCH | Freq: Once | TRANSDERMAL | Status: DC | PRN
  Administered 2016-08-23: 1.5 mg via TRANSDERMAL

## 2016-08-23 MED ORDER — PROPOFOL 500 MG/50ML IV EMUL
INTRAVENOUS | Status: AC
  Filled 2016-08-23: qty 50

## 2016-08-23 MED ORDER — CEFAZOLIN SODIUM-DEXTROSE 2-4 GM/100ML-% IV SOLN
INTRAVENOUS | Status: AC
  Filled 2016-08-23: qty 100

## 2016-08-23 MED ORDER — FENTANYL CITRATE (PF) 100 MCG/2ML IJ SOLN: 50.0000 ug | INTRAMUSCULAR | Status: DC | PRN

## 2016-08-23 MED ORDER — LACTATED RINGERS IV SOLN
INTRAVENOUS | Status: DC
  Administered 2016-08-23: 10 mL/h via INTRAVENOUS

## 2016-08-23 MED ORDER — KETOROLAC TROMETHAMINE 30 MG/ML IJ SOLN
30.0000 mg | Freq: Once | INTRAMUSCULAR | Status: AC | PRN
  Administered 2016-08-23: 30 mg via INTRAVENOUS

## 2016-08-23 MED ORDER — FENTANYL CITRATE (PF) 100 MCG/2ML IJ SOLN
INTRAMUSCULAR | Status: DC | PRN
  Administered 2016-08-23: 100 ug via INTRAVENOUS

## 2016-08-23 MED ORDER — SCOPOLAMINE 1 MG/3DAYS TD PT72
MEDICATED_PATCH | TRANSDERMAL | Status: AC
  Filled 2016-08-23: qty 1

## 2016-08-23 MED ORDER — MIDAZOLAM HCL 5 MG/5ML IJ SOLN
INTRAMUSCULAR | Status: DC | PRN
  Administered 2016-08-23: 2 mg via INTRAVENOUS

## 2016-08-23 MED ORDER — KETOROLAC TROMETHAMINE 30 MG/ML IJ SOLN
INTRAMUSCULAR | Status: AC
  Filled 2016-08-23: qty 1

## 2016-08-23 MED ORDER — BUPIVACAINE HCL (PF) 0.5 % IJ SOLN
INTRAMUSCULAR | Status: AC
  Filled 2016-08-23: qty 30

## 2016-08-23 MED ORDER — CHLORHEXIDINE GLUCONATE CLOTH 2 % EX PADS: 6.0000 | MEDICATED_PAD | Freq: Once | CUTANEOUS | Status: DC

## 2016-08-23 MED ORDER — PROPOFOL 10 MG/ML IV BOLUS
INTRAVENOUS | Status: DC | PRN
  Administered 2016-08-23: 200 mg via INTRAVENOUS

## 2016-08-23 MED ORDER — CEFAZOLIN SODIUM-DEXTROSE 2-4 GM/100ML-% IV SOLN
2.0000 g | INTRAVENOUS | Status: AC
  Administered 2016-08-23: 2 g via INTRAVENOUS

## 2016-08-23 SURGICAL SUPPLY — 48 items
ADH SKN CLS APL DERMABOND .7 (GAUZE/BANDAGES/DRESSINGS) ×2
BLADE CLIPPER SURG (BLADE) IMPLANT
BLADE HEX COATED 2.75 (ELECTRODE) ×4 IMPLANT
BLADE SURG 15 STRL LF DISP TIS (BLADE) ×2 IMPLANT
BLADE SURG 15 STRL SS (BLADE) ×4
CANISTER SUCT 1200ML W/VALVE (MISCELLANEOUS) IMPLANT
CHLORAPREP W/TINT 26ML (MISCELLANEOUS) ×4 IMPLANT
CLOSURE WOUND 1/2 X4 (GAUZE/BANDAGES/DRESSINGS)
COVER BACK TABLE 60X90IN (DRAPES) ×4 IMPLANT
COVER MAYO STAND STRL (DRAPES) ×4 IMPLANT
DECANTER SPIKE VIAL GLASS SM (MISCELLANEOUS) IMPLANT
DERMABOND ADVANCED (GAUZE/BANDAGES/DRESSINGS) ×2
DERMABOND ADVANCED .7 DNX12 (GAUZE/BANDAGES/DRESSINGS) ×3 IMPLANT
DRAPE LAPAROTOMY 100X72 PEDS (DRAPES) ×4 IMPLANT
DRAPE UTILITY XL STRL (DRAPES) ×4 IMPLANT
ELECT REM PT RETURN 9FT ADLT (ELECTROSURGICAL) ×4
ELECTRODE REM PT RTRN 9FT ADLT (ELECTROSURGICAL) ×2 IMPLANT
GLOVE BIOGEL PI IND STRL 7.0 (GLOVE) ×2 IMPLANT
GLOVE BIOGEL PI INDICATOR 7.0 (GLOVE) ×4
GLOVE SURG SIGNA 7.5 PF LTX (GLOVE) ×4 IMPLANT
GLOVE SURG SS PI 7.0 STRL IVOR (GLOVE) ×3 IMPLANT
GLOVE SURG SS PI 7.5 STRL IVOR (GLOVE) ×3 IMPLANT
GOWN STRL REUS W/ TWL LRG LVL3 (GOWN DISPOSABLE) ×2 IMPLANT
GOWN STRL REUS W/ TWL XL LVL3 (GOWN DISPOSABLE) ×2 IMPLANT
GOWN STRL REUS W/TWL LRG LVL3 (GOWN DISPOSABLE) ×4
GOWN STRL REUS W/TWL XL LVL3 (GOWN DISPOSABLE) ×4
NDL HYPO 25X1 1.5 SAFETY (NEEDLE) ×1 IMPLANT
NEEDLE HYPO 25X1 1.5 SAFETY (NEEDLE) ×4 IMPLANT
NS IRRIG 1000ML POUR BTL (IV SOLUTION) ×3 IMPLANT
PACK BASIN DAY SURGERY FS (CUSTOM PROCEDURE TRAY) ×4 IMPLANT
PENCIL BUTTON HOLSTER BLD 10FT (ELECTRODE) ×4 IMPLANT
SLEEVE SCD COMPRESS KNEE MED (MISCELLANEOUS) ×6 IMPLANT
SPONGE LAP 4X18 X RAY DECT (DISPOSABLE) ×4 IMPLANT
STRIP CLOSURE SKIN 1/2X4 (GAUZE/BANDAGES/DRESSINGS) IMPLANT
SUT MNCRL AB 4-0 PS2 18 (SUTURE) ×3 IMPLANT
SUT PROLENE 3 0 PS 2 (SUTURE) IMPLANT
SUT VIC AB 2-0 SH 27 (SUTURE)
SUT VIC AB 2-0 SH 27XBRD (SUTURE) IMPLANT
SUT VIC AB 3-0 SH 27 (SUTURE) ×4
SUT VIC AB 3-0 SH 27X BRD (SUTURE) ×1 IMPLANT
SYR BULB 3OZ (MISCELLANEOUS) IMPLANT
SYR CONTROL 10ML LL (SYRINGE) ×4 IMPLANT
TOWEL OR 17X24 6PK STRL BLUE (TOWEL DISPOSABLE) ×4 IMPLANT
TOWEL OR NON WOVEN STRL DISP B (DISPOSABLE) ×4 IMPLANT
TRAY DSU PREP LF (CUSTOM PROCEDURE TRAY) IMPLANT
TUBE CONNECTING 20'X1/4 (TUBING)
TUBE CONNECTING 20X1/4 (TUBING) IMPLANT
YANKAUER SUCT BULB TIP NO VENT (SUCTIONS) IMPLANT

## 2016-08-23 NOTE — Transfer of Care (Signed)
Immediate Anesthesia Transfer of Care Note  Patient: Jessica Robinson  Procedure(s) Performed: Procedure(s): RIGHT FLANK EXPLORATION (Right)  Patient Location: PACU  Anesthesia Type:General  Level of Consciousness: awake and patient cooperative  Airway & Oxygen Therapy: Patient Spontanous Breathing and Patient connected to face mask oxygen  Post-op Assessment: Report given to RN and Post -op Vital signs reviewed and stable  Post vital signs: Reviewed and stable  Last Vitals:  Vitals:   08/23/16 0644  BP: 114/82  Pulse: 90  Resp: 18  Temp: 36.6 C    Last Pain:  Vitals:   08/23/16 0644  TempSrc: Oral  PainSc: 5          Complications: No apparent anesthesia complications

## 2016-08-23 NOTE — Op Note (Signed)
RIGHT FLANK EXPLORATION  Procedure Note  LAYTOYA GUNSOLUS 08/23/2016   Pre-op Diagnosis: Right flank mass     Post-op Diagnosis: same  Procedure(s): RIGHT FLANK EXPLORATION  Surgeon(s): Coralie Keens, MD  Anesthesia: General  Staff:  Circulator: Eston Esters, RN Scrub Person: Dub Mikes  Estimated Blood Loss: 5cc               Specimens: sent to path  Indications: This is a 45 year old female who presents with a right flank mass. She has had pain in her right flank for several years. She's had a CT scan which was unremarkable. The pain is mostly when she lies on her side. She had her husband to palpate a mass. I have had a difficult time feeling it in the right flank. After long discussion, she wished for me to explore this area and operating room.  Findings: Minimal lipomatous tissue measuring approximately 1-1.5 cm and no other abnormalities identified.  Procedure: The patient was identified in the holding area. I marked the area of palpable concern. This area was pointed out by both her and her husband. She was then taken to the operating room and placed supine on the operating table and general anesthesia was induced. She was then turned slightly on her left side. Her right flank was then prepped and draped in the usual sterile fashion. I made an incision over the previously marked area with a scalpel after anesthetizing the skin with Marcaine. I took this into the subtenons tissue with electrocautery. There were several small pieces of fat consistent with possible lipomata broke up easily. I removed this tissue sent to pathology. I then extensively probed her flank point down to the ribs, the muscles of the back, and to the iliac wing. With extensive probing and dissection, identified no other masses or abnormalities. I did this in all directions for several inches away from the incision. I then anesthetized wound further with Marcaine. I then closed subjacent tissue with  interrupted 3-0 Vicryl sutures and closed the skin with a running 4-0 Monocryl. Skin glue was then applied. The patient tolerated procedure well. All counts were correct at the end procedure. The patient was then made in the operating room and taken in stable condition to the recovery room.          Cherissa Hook A   Date: 08/23/2016  Time: 8:12 AM

## 2016-08-23 NOTE — Anesthesia Preprocedure Evaluation (Addendum)
Anesthesia Evaluation  Patient identified by MRN, date of birth, ID band Patient awake    Reviewed: Allergy & Precautions, NPO status , Patient's Chart, lab work & pertinent test results  History of Anesthesia Complications (+) PONV  Airway Mallampati: II  TM Distance: >3 FB Neck ROM: Full    Dental  (+) Dental Advisory Given   Pulmonary neg pulmonary ROS,    breath sounds clear to auscultation       Cardiovascular negative cardio ROS   Rhythm:Regular Rate:Normal     Neuro/Psych negative neurological ROS     GI/Hepatic negative GI ROS, Neg liver ROS,   Endo/Other  negative endocrine ROS  Renal/GU negative Renal ROS     Musculoskeletal  (+) Arthritis ,   Abdominal   Peds  Hematology negative hematology ROS (+)   Anesthesia Other Findings   Reproductive/Obstetrics                            Anesthesia Physical Anesthesia Plan  ASA: I  Anesthesia Plan: General   Post-op Pain Management:    Induction: Intravenous  Airway Management Planned: LMA  Additional Equipment:   Intra-op Plan:   Post-operative Plan: Extubation in OR  Informed Consent: I have reviewed the patients History and Physical, chart, labs and discussed the procedure including the risks, benefits and alternatives for the proposed anesthesia with the patient or authorized representative who has indicated his/her understanding and acceptance.   Dental advisory given  Plan Discussed with: CRNA  Anesthesia Plan Comments:         Anesthesia Quick Evaluation

## 2016-08-23 NOTE — Discharge Instructions (Signed)
Ok to shower tomorrow  Ice pack and ibuprofen also for pain   Call your surgeon if you experience:   1.  Fever over 101.0. 2.  Inability to urinate. 3.  Nausea and/or vomiting. 4.  Extreme swelling or bruising at the surgical site. 5.  Continued bleeding from the incision. 6.  Increased pain, redness or drainage from the incision. 7.  Problems related to your pain medication. 8.  Any problems and/or concerns Post Anesthesia Home Care Instructions  Activity: Get plenty of rest for the remainder of the day. A responsible adult should stay with you for 24 hours following the procedure.  For the next 24 hours, DO NOT: -Drive a car -Paediatric nurse -Drink alcoholic beverages -Take any medication unless instructed by your physician -Make any legal decisions or sign important papers.  Meals: Start with liquid foods such as gelatin or soup. Progress to regular foods as tolerated. Avoid greasy, spicy, heavy foods. If nausea and/or vomiting occur, drink only clear liquids until the nausea and/or vomiting subsides. Call your physician if vomiting continues.  Special Instructions/Symptoms: Your throat may feel dry or sore from the anesthesia or the breathing tube placed in your throat during surgery. If this causes discomfort, gargle with warm salt water. The discomfort should disappear within 24 hours.  If you had a scopolamine patch placed behind your ear for the management of post- operative nausea and/or vomiting:  1. The medication in the patch is effective for 72 hours, after which it should be removed.  Wrap patch in a tissue and discard in the trash. Wash hands thoroughly with soap and water. 2. You may remove the patch earlier than 72 hours if you experience unpleasant side effects which may include dry mouth, dizziness or visual disturbances. 3. Avoid touching the patch. Wash your hands with soap and water after contact with the patch.

## 2016-08-23 NOTE — Anesthesia Procedure Notes (Signed)
Procedure Name: LMA Insertion Date/Time: 08/23/2016 7:33 AM Performed by: Alyaan Budzynski D Pre-anesthesia Checklist: Patient identified, Emergency Drugs available, Suction available and Patient being monitored Patient Re-evaluated:Patient Re-evaluated prior to inductionOxygen Delivery Method: Circle system utilized Preoxygenation: Pre-oxygenation with 100% oxygen Intubation Type: IV induction Ventilation: Mask ventilation without difficulty LMA: LMA inserted LMA Size: 4.0 Number of attempts: 1 Airway Equipment and Method: Bite block Placement Confirmation: positive ETCO2 Tube secured with: Tape Dental Injury: Teeth and Oropharynx as per pre-operative assessment

## 2016-08-23 NOTE — Interval H&P Note (Signed)
History and Physical Interval Note: no change in H and P  08/23/2016 7:18 AM  Jessica Robinson  has presented today for surgery, with the diagnosis of Right flank mass  The various methods of treatment have been discussed with the patient and family. After consideration of risks, benefits and other options for treatment, the patient has consented to  Procedure(s): EXCISION RIGHT FLANK MASS (Right) as a surgical intervention .  The patient's history has been reviewed, patient examined, no change in status, stable for surgery.  I have reviewed the patient's chart and labs.  Questions were answered to the patient's satisfaction.     Fremont Skalicky A

## 2016-08-23 NOTE — Anesthesia Postprocedure Evaluation (Signed)
Anesthesia Post Note  Patient: Jessica Robinson  Procedure(s) Performed: Procedure(s) (LRB): RIGHT FLANK EXPLORATION (Right)  Patient location during evaluation: PACU Anesthesia Type: General Level of consciousness: awake and alert Pain management: pain level controlled Vital Signs Assessment: post-procedure vital signs reviewed and stable Respiratory status: spontaneous breathing, nonlabored ventilation, respiratory function stable and patient connected to nasal cannula oxygen Cardiovascular status: blood pressure returned to baseline and stable Postop Assessment: no signs of nausea or vomiting Anesthetic complications: no       Last Vitals:  Vitals:   08/23/16 0845 08/23/16 0914  BP: 114/79 117/79  Pulse: 69 64  Resp: 16 18  Temp:  36.4 C    Last Pain:  Vitals:   08/23/16 0914  TempSrc:   PainSc: 3                  Tiajuana Amass

## 2016-08-25 ENCOUNTER — Emergency Department (HOSPITAL_COMMUNITY)
Admission: EM | Admit: 2016-08-25 | Discharge: 2016-08-25 | Disposition: A | Discharge status: Home / Self Care | Payer: PRIVATE HEALTH INSURANCE | Attending: Emergency Medicine | Admitting: Emergency Medicine

## 2016-08-25 ENCOUNTER — Emergency Department (HOSPITAL_COMMUNITY): Payer: PRIVATE HEALTH INSURANCE

## 2016-08-25 ENCOUNTER — Encounter (HOSPITAL_COMMUNITY): Payer: Self-pay | Admitting: Emergency Medicine

## 2016-08-25 DIAGNOSIS — R6883 Chills (without fever): Secondary | ICD-10-CM | POA: Diagnosis present

## 2016-08-25 DIAGNOSIS — R509 Fever, unspecified: Secondary | ICD-10-CM

## 2016-08-25 DIAGNOSIS — Z91040 Latex allergy status: Secondary | ICD-10-CM | POA: Diagnosis not present

## 2016-08-25 DIAGNOSIS — R5082 Postprocedural fever: Secondary | ICD-10-CM | POA: Diagnosis not present

## 2016-08-25 LAB — CBC WITH DIFFERENTIAL/PLATELET
BASOS ABS: 0 10*3/uL (ref 0.0–0.1)
BASOS PCT: 0 %
Eosinophils Absolute: 0.1 10*3/uL (ref 0.0–0.7)
Eosinophils Relative: 1 %
HEMATOCRIT: 38.9 % (ref 36.0–46.0)
Hemoglobin: 13.4 g/dL (ref 12.0–15.0)
LYMPHS PCT: 8 %
Lymphs Abs: 0.5 10*3/uL — ABNORMAL LOW (ref 0.7–4.0)
MCH: 29.5 pg (ref 26.0–34.0)
MCHC: 34.4 g/dL (ref 30.0–36.0)
MCV: 85.7 fL (ref 78.0–100.0)
Monocytes Absolute: 0.6 10*3/uL (ref 0.1–1.0)
Monocytes Relative: 8 %
NEUTROS ABS: 5.7 10*3/uL (ref 1.7–7.7)
NEUTROS PCT: 83 %
Platelets: 266 10*3/uL (ref 150–400)
RBC: 4.54 MIL/uL (ref 3.87–5.11)
RDW: 13.9 % (ref 11.5–15.5)
WBC: 6.9 10*3/uL (ref 4.0–10.5)

## 2016-08-25 LAB — URINALYSIS, ROUTINE W REFLEX MICROSCOPIC
BILIRUBIN URINE: NEGATIVE
GLUCOSE, UA: NEGATIVE mg/dL
KETONES UR: NEGATIVE mg/dL
Leukocytes, UA: NEGATIVE
NITRITE: NEGATIVE
PH: 6.5 (ref 5.0–8.0)
Protein, ur: NEGATIVE mg/dL

## 2016-08-25 LAB — COMPREHENSIVE METABOLIC PANEL
ALBUMIN: 3.4 g/dL — AB (ref 3.5–5.0)
ALK PHOS: 61 U/L (ref 38–126)
ALT: 16 U/L (ref 14–54)
AST: 23 U/L (ref 15–41)
Anion gap: 7 (ref 5–15)
BUN: 16 mg/dL (ref 6–20)
CALCIUM: 9.2 mg/dL (ref 8.9–10.3)
CHLORIDE: 101 mmol/L (ref 101–111)
CO2: 25 mmol/L (ref 22–32)
CREATININE: 0.9 mg/dL (ref 0.44–1.00)
GFR calc Af Amer: >60 mL/min (ref >60)
GFR calc non Af Amer: >60 mL/min (ref >60)
GLUCOSE: 120 mg/dL — AB (ref 65–99)
Potassium: 3.4 mmol/L — ABNORMAL LOW (ref 3.5–5.1)
SODIUM: 133 mmol/L — AB (ref 135–145)
Total Bilirubin: 0.6 mg/dL (ref 0.3–1.2)
Total Protein: 6.5 g/dL (ref 6.5–8.1)

## 2016-08-25 LAB — URINALYSIS, MICROSCOPIC (REFLEX)
Bacteria, UA: NONE SEEN
WBC UA: NONE SEEN WBC/hpf (ref 0–5)

## 2016-08-25 LAB — I-STAT CG4 LACTIC ACID, ED: Lactic Acid, Venous: 1.21 mmol/L (ref 0.5–1.9)

## 2016-08-25 MED ORDER — KETOROLAC TROMETHAMINE 30 MG/ML IJ SOLN
30.0000 mg | Freq: Once | INTRAMUSCULAR | Status: AC
  Administered 2016-08-25: 30 mg via INTRAVENOUS
  Filled 2016-08-25: qty 1

## 2016-08-25 MED ORDER — SODIUM CHLORIDE 0.9 % IV BOLUS (SEPSIS)
1000.0000 mL | Freq: Once | INTRAVENOUS | Status: AC
  Administered 2016-08-25: 1000 mL via INTRAVENOUS

## 2016-08-25 MED ORDER — FENTANYL CITRATE (PF) 100 MCG/2ML IJ SOLN
50.0000 ug | Freq: Once | INTRAMUSCULAR | Status: AC
  Administered 2016-08-25: 50 ug via INTRAVENOUS
  Filled 2016-08-25: qty 2

## 2016-08-25 NOTE — ED Notes (Signed)
Pt transported to xray 

## 2016-08-25 NOTE — ED Provider Notes (Signed)
Jessica Robinson   CSN: [id number] Arrival date & time: 08/25/16  1934     History   Chief Complaint Chief Complaint  Patient presents with  . Chills    HPI Jessica Robinson is a 45 y.o. female.  HPI Patient presents with fevers chills and headache. Began this afternoon. States she had temperature up to 102. Head right flank surgery done 2 days ago with Dr. Ninfa Linden. No cough. No sore throat. States she aches all over. No dysuria. No shortness of breath. No diarrhea. No drainage from the wound.   Past Medical History:  Diagnosis Date  . Arthritis    back  . Complication of anesthesia    states heart rate always drops; has resting heart rate in 60s  . History of migraine   . PONV (postoperative nausea and vomiting)   . Right flank mass 07/2016    Patient Active Problem List   Diagnosis Date Noted  . Nonspecific (abnormal) findings on radiological and other examination of gastrointestinal tract 11/28/2012  . Abdominal pain, right upper quadrant 11/28/2012  . Chronic cholecystitis 10/27/2012    Past Surgical History:  Procedure Laterality Date  . ABDOMINAL HYSTERECTOMY     complete  . AUGMENTATION MAMMAPLASTY Bilateral 2003  . BICEPS TENODESIS Left 06/27/2011  . CHOLECYSTECTOMY N/A 11/20/2012   Procedure: LAPAROSCOPIC CHOLECYSTECTOMY possible IOC ;  Surgeon: Harl Bowie, MD;  Location: [place];  Service: General;  Laterality: N/A;  . ERCP N/A 11/27/2012   Procedure: ENDOSCOPIC RETROGRADE CHOLANGIOPANCREATOGRAPHY (ERCP);  Surgeon: Beryle Beams, MD;  Location[facility] ENDOSCOPY;  Service: Endoscopy;  Laterality: N/A;  . FOOT NEUROMA SURGERY Right ~ 2008  . HIP ARTHROSCOPY Left 08/23/2014  . KNEE ARTHROSCOPY Left 03/15/2014  . SHOULDER ARTHROSCOPY W/ ROTATOR CUFF REPAIR Right 06/27/2011  . WOUND EXPLORATION Right 08/23/2016   Procedure: RIGHT FLANK EXPLORATION;  Surgeon: Coralie Keens, MD;  Location: [place];  Service: General;   Laterality: Right;    OB History    No data available       Home Medications    Prior to Admission medications   Medication Sig Start Date End Date Taking? Authorizing Provider  estrogens, conjugated, (PREMARIN) 1.25 MG tablet Take 1.25 mg by mouth daily.    Historical Provider, MD  oxyCODONE-acetaminophen (ROXICET) 5-325 MG tablet Take 1-2 tablets by mouth every 4 (four) hours as needed for severe pain. 08/23/16   Coralie Keens, MD    Family History Family History  Problem Relation Age of Onset  . Cancer Father     waldenstroms  . Cancer Sister     Theadora Rama  . Cancer Paternal Aunt     breast  . Cancer Paternal Grandmother     breat    Social History Social History  Substance Use Topics  . Smoking status: Never Smoker  . Smokeless tobacco: Never Used  . Alcohol use Yes     Comment: occasionally     Allergies   Adhesive [tape]; Erythromycin; and Latex   Review of Systems Review of Systems  Constitutional: Positive for appetite change and fever.  HENT: Negative for congestion.   Respiratory: Negative for shortness of breath.   Cardiovascular: Negative for chest pain.  Gastrointestinal: Negative for abdominal pain.  Genitourinary: Negative for dysuria.  Musculoskeletal: Positive for myalgias.  Skin: Positive for wound. Negative for rash.  Neurological: Positive for headaches.  Hematological: Negative for adenopathy.     Physical Exam Updated Vital Signs BP 117/79  Pulse 80   Temp 98.9 F (37.2 C) (Oral)   Resp 18   Ht 5\' 7"  (1.702 m)   Wt 177 lb (80.3 kg)   SpO2 98%   BMI 27.72 kg/m   Physical Exam  Constitutional: She appears well-developed.  HENT:  Head: Atraumatic.  Mouth/Throat: No oropharyngeal exudate.  Neck: Neck supple.  Cardiovascular: Normal rate.   Pulmonary/Chest: Effort normal.  Musculoskeletal: She exhibits tenderness.  Right posterior flank area has healing 4 cm wound. There is some surrounding erythema that was  demarcated. Per the patient's husband this is less red than it was yesterday. No fluctuance.  Neurological: She is alert.  Skin: Skin is warm. Capillary refill takes less than 2 seconds.  Psychiatric: She has a normal mood and affect.     ED Treatments / Results  Labs (all labs ordered are listed, but only abnormal results are displayed) Labs Reviewed  CBC WITH DIFFERENTIAL/PLATELET - Abnormal; Notable for the following:       Result Value   Lymphs Abs 0.5 (*)    All other components within normal limits  COMPREHENSIVE METABOLIC PANEL - Abnormal; Notable for the following:    Sodium 133 (*)    Potassium 3.4 (*)    Glucose, Bld 120 (*)    Albumin 3.4 (*)    All other components within normal limits  URINALYSIS, ROUTINE W REFLEX MICROSCOPIC - Abnormal; Notable for the following:    Specific Gravity, Urine <1.005 (*)    Hgb urine dipstick TRACE (*)    All other components within normal limits  URINALYSIS, MICROSCOPIC (REFLEX) - Abnormal; Notable for the following:    Squamous Epithelial / LPF 0-5 (*)    All other components within normal limits  I-STAT CG4 LACTIC ACID, ED    EKG  EKG Interpretation None       Radiology Dg Chest 2 View  Result Date: 08/25/2016 CLINICAL DATA:  Chills beginning this afternoon. Recent right abdominal surgery. EXAM: CHEST  2 VIEW COMPARISON:  07/06/2015 FINDINGS: Heart size is normal. Mediastinal shadows are normal. There may be mild central bronchial thickening but there is no infiltrate, collapse or effusion. No acute bone finding. Breast implants. IMPRESSION: Possible bronchitis.  No consolidation or collapse. Electronically Signed   By: Nelson Chimes M.D.   On: 08/25/2016 21:37    Procedures Procedures (including critical care time)  Medications Ordered in ED Medications  sodium chloride 0.9 % bolus 1,000 mL (1,000 mLs Intravenous New Bag/Given 08/25/16 2125)  ketorolac (TORADOL) 30 MG/ML injection 30 mg (30 mg Intravenous Given 08/25/16  2121)  fentaNYL (SUBLIMAZE) injection 50 mcg (50 mcg Intravenous Given 08/25/16 2216)     Initial Impression / Assessment and Plan / ED Course  I have reviewed the triage vital signs and the nursing notes.  Pertinent labs & imaging results that were available during my care of the patient were reviewed by me and considered in my medical decision making (see chart for details).  Clinical Course     Patient presents with fevers and chills 2 days postop. Had flank surgery done. Slight redness of the wound but still only 2 days out. No drainage. X-ray showed possible bronchitis. Labs reassuring. Urine does not show infection. Normal lactic acid. Could be early URI symptoms. Will discharge home.  Final Clinical Impressions(s) / ED Diagnoses   Final diagnoses:  Fever, unspecified fever cause    New Prescriptions New Prescriptions   No medications on file     Davonna Belling,  MD 08/25/16 2235

## 2016-08-25 NOTE — ED Triage Notes (Signed)
Patient reports chills onset this afternoon , no fever or body aches , denies emesis or diarrhea , respirations unlabored , she had a surgery ( right flank ) done 2 days ago by Dr. Ninfa Linden .

## 2017-01-21 DIAGNOSIS — R000 Tachycardia, unspecified: Secondary | ICD-10-CM | POA: Insufficient documentation

## 2017-01-21 DIAGNOSIS — R002 Palpitations: Secondary | ICD-10-CM

## 2017-01-21 HISTORY — DX: Palpitations: R00.2

## 2017-01-21 HISTORY — DX: Tachycardia, unspecified: R00.0

## 2017-04-15 ENCOUNTER — Other Ambulatory Visit: Payer: Self-pay | Admitting: General Practice

## 2017-04-15 DIAGNOSIS — Z1231 Encounter for screening mammogram for malignant neoplasm of breast: Secondary | ICD-10-CM

## 2017-04-24 ENCOUNTER — Other Ambulatory Visit: Payer: Self-pay | Admitting: General Practice

## 2017-04-24 ENCOUNTER — Ambulatory Visit
Admission: RE | Admit: 2017-04-24 | Discharge: 2017-04-24 | Disposition: A | Discharge status: Home / Self Care | Payer: PRIVATE HEALTH INSURANCE | Source: Ambulatory Visit | Attending: General Practice | Admitting: General Practice

## 2017-04-24 DIAGNOSIS — Z1231 Encounter for screening mammogram for malignant neoplasm of breast: Secondary | ICD-10-CM

## 2018-03-16 ENCOUNTER — Other Ambulatory Visit: Payer: Self-pay | Admitting: General Practice

## 2018-03-16 DIAGNOSIS — Z1231 Encounter for screening mammogram for malignant neoplasm of breast: Secondary | ICD-10-CM

## 2018-04-28 ENCOUNTER — Ambulatory Visit: Payer: PRIVATE HEALTH INSURANCE

## 2018-05-25 ENCOUNTER — Ambulatory Visit
Admission: RE | Admit: 2018-05-25 | Discharge: 2018-05-25 | Disposition: A | Discharge status: Home / Self Care | Payer: PRIVATE HEALTH INSURANCE | Source: Ambulatory Visit | Attending: General Practice | Admitting: General Practice

## 2018-05-25 DIAGNOSIS — Z1231 Encounter for screening mammogram for malignant neoplasm of breast: Secondary | ICD-10-CM

## 2018-05-26 ENCOUNTER — Other Ambulatory Visit: Payer: Self-pay | Admitting: General Practice

## 2018-05-26 DIAGNOSIS — R928 Other abnormal and inconclusive findings on diagnostic imaging of breast: Secondary | ICD-10-CM

## 2018-05-29 ENCOUNTER — Ambulatory Visit
Admission: RE | Admit: 2018-05-29 | Discharge: 2018-05-29 | Disposition: A | Discharge status: Home / Self Care | Payer: PRIVATE HEALTH INSURANCE | Source: Ambulatory Visit | Attending: General Practice | Admitting: General Practice

## 2018-05-29 ENCOUNTER — Ambulatory Visit
Admission: RE | Admit: 2018-05-29 | Discharge: 2018-05-29 | Disposition: A | Discharge status: Home / Self Care | Payer: BLUE CROSS/BLUE SHIELD | Source: Ambulatory Visit | Attending: General Practice | Admitting: General Practice

## 2018-05-29 DIAGNOSIS — R928 Other abnormal and inconclusive findings on diagnostic imaging of breast: Secondary | ICD-10-CM

## 2019-05-10 ENCOUNTER — Other Ambulatory Visit: Payer: Self-pay | Admitting: General Practice

## 2019-05-10 DIAGNOSIS — Z1231 Encounter for screening mammogram for malignant neoplasm of breast: Secondary | ICD-10-CM

## 2019-06-23 ENCOUNTER — Ambulatory Visit: Payer: BLUE CROSS/BLUE SHIELD

## 2019-08-11 ENCOUNTER — Other Ambulatory Visit: Payer: Self-pay

## 2019-08-11 ENCOUNTER — Other Ambulatory Visit: Payer: Self-pay | Admitting: General Practice

## 2019-08-11 ENCOUNTER — Ambulatory Visit
Admission: RE | Admit: 2019-08-11 | Discharge: 2019-08-11 | Disposition: A | Discharge status: Home / Self Care | Payer: BC Managed Care – PPO | Source: Ambulatory Visit | Attending: General Practice | Admitting: General Practice

## 2019-08-11 DIAGNOSIS — N644 Mastodynia: Secondary | ICD-10-CM

## 2019-08-11 DIAGNOSIS — Z1231 Encounter for screening mammogram for malignant neoplasm of breast: Secondary | ICD-10-CM

## 2019-08-18 ENCOUNTER — Other Ambulatory Visit: Payer: Self-pay | Admitting: Orthopedic Surgery

## 2019-08-18 DIAGNOSIS — M25511 Pain in right shoulder: Secondary | ICD-10-CM

## 2019-09-01 ENCOUNTER — Ambulatory Visit
Admission: RE | Admit: 2019-09-01 | Discharge: 2019-09-01 | Disposition: A | Discharge status: Home / Self Care | Payer: BC Managed Care – PPO | Source: Ambulatory Visit | Attending: Orthopedic Surgery | Admitting: Orthopedic Surgery

## 2019-09-01 ENCOUNTER — Other Ambulatory Visit: Payer: Self-pay

## 2019-09-01 DIAGNOSIS — M75121 Complete rotator cuff tear or rupture of right shoulder, not specified as traumatic: Secondary | ICD-10-CM | POA: Diagnosis not present

## 2019-09-01 DIAGNOSIS — M25511 Pain in right shoulder: Secondary | ICD-10-CM | POA: Diagnosis present

## 2019-09-01 DIAGNOSIS — M19011 Primary osteoarthritis, right shoulder: Secondary | ICD-10-CM | POA: Diagnosis not present

## 2019-09-01 MED ORDER — IOHEXOL 180 MG/ML  SOLN
20.0000 mL | Freq: Once | INTRAMUSCULAR | Status: AC | PRN
  Administered 2019-09-01: 11:00:00 15 mL

## 2019-09-01 MED ORDER — LIDOCAINE HCL (PF) 1 % IJ SOLN
5.0000 mL | Freq: Once | INTRAMUSCULAR | Status: AC
  Administered 2019-09-01: 11:00:00 5 mL
  Filled 2019-09-01: qty 5

## 2019-09-01 MED ORDER — GADOBUTROL 1 MMOL/ML IV SOLN
2.0000 mL | Freq: Once | INTRAVENOUS | Status: AC | PRN
  Administered 2019-09-01: 11:00:00 0.05 mL

## 2019-09-01 MED ORDER — SODIUM CHLORIDE (PF) 0.9 % IJ SOLN
10.0000 mL | INTRAMUSCULAR | Status: DC | PRN
  Administered 2019-09-01: 11:00:00 5 mL

## 2020-02-17 ENCOUNTER — Encounter: Payer: Self-pay | Admitting: Cardiology

## 2020-02-17 ENCOUNTER — Ambulatory Visit (INDEPENDENT_AMBULATORY_CARE_PROVIDER_SITE_OTHER): Payer: BC Managed Care – PPO

## 2020-02-17 ENCOUNTER — Other Ambulatory Visit: Payer: Self-pay

## 2020-02-17 ENCOUNTER — Ambulatory Visit: Payer: BC Managed Care – PPO | Admitting: Cardiology

## 2020-02-17 DIAGNOSIS — E0590 Thyrotoxicosis, unspecified without thyrotoxic crisis or storm: Secondary | ICD-10-CM

## 2020-02-17 DIAGNOSIS — R002 Palpitations: Secondary | ICD-10-CM | POA: Insufficient documentation

## 2020-02-17 DIAGNOSIS — R011 Cardiac murmur, unspecified: Secondary | ICD-10-CM

## 2020-02-17 HISTORY — DX: Cardiac murmur, unspecified: R01.1

## 2020-02-17 HISTORY — DX: Thyrotoxicosis, unspecified without thyrotoxic crisis or storm: E05.90

## 2020-02-17 HISTORY — DX: Palpitations: R00.2

## 2020-02-17 NOTE — Patient Instructions (Signed)
Medication Instructions:  No medication changes. *If you need a refill on your cardiac medications before your next appointment, please call your pharmacy*   Lab Work: None ordered If you have labs (blood work) drawn today and your tests are completely normal, you will receive your results only by: Marland Kitchen MyChart Message (if you have MyChart) OR . A paper copy in the mail If you have any lab test that is abnormal or we need to change your treatment, we will call you to review the results.   Testing/Procedures: Your physician has requested that you have an echocardiogram. Echocardiography is a painless test that uses sound waves to create images of your heart. It provides your doctor with information about the size and shape of your heart and how well your heart's chambers and valves are working. This procedure takes approximately one hour. There are no restrictions for this procedure.   We will order CT coronary calcium score $150  Please call [phone] to schedule   [facility]  [address]  [place], [place] [address]   WHY IS MY DOCTOR PRESCRIBING ZIO? The Zio system is proven and trusted by physicians to detect and diagnose irregular heart rhythms -- and has been prescribed to hundreds of thousands of patients.  The FDA has cleared the Zio system to monitor for many different kinds of irregular heart rhythms. In a study, physicians were able to reach a diagnosis 90% of the time with the Zio system1.  You can wear the Zio monitor -- a small, discreet, comfortable patch -- during your normal day-to-day activity, including while you sleep, shower, and exercise, while it records every single heartbeat for analysis.  1Barrett, P., et al. Comparison of 24 Hour Holter Monitoring Versus 14 Day Novel Adhesive Patch Electrocardiographic Monitoring. [employer], 2014.  ZIO VS. HOLTER MONITORING The Zio monitor can be comfortably worn for up to 14 days. Holter  monitors can be worn for 24 to 48 hours, limiting the time to record any irregular heart rhythms you may have. Zio is able to capture data for the 51% of patients who have their first symptom-triggered arrhythmia after 48 hours.1  LIVE WITHOUT RESTRICTIONS The Zio ambulatory cardiac monitor is a small, unobtrusive, and water-resistant patch--you might even forget you're wearing it. The Zio monitor records and stores every beat of your heart, whether you're sleeping, working out, or showering.  Wear the monitor for 2 weeks, remove on 03/02/20.    Follow-Up: At [facility], you and your health needs are our priority.  As part of our continuing mission to provide you with exceptional heart care, we have created designated Provider Care Teams.  These Care Teams include your primary Cardiologist (physician) and Advanced Practice Providers (APPs -  Physician Assistants and Nurse Practitioners) who all work together to provide you with the care you need, when you need it.  We recommend signing up for the patient portal called "MyChart".  Sign up information is provided on this After Visit Summary.  MyChart is used to connect with patients for Virtual Visits (Telemedicine).  Patients are able to view lab/test results, encounter notes, upcoming appointments, etc.  Non-urgent messages can be sent to your provider as well.   To learn more about what you can do with MyChart, go to [email or web address].    Your next appointment:   3 month(s)  The format for your next appointment:   In Person  Provider:   Jyl Heinz, MD  Other Instructions  Coronary Calcium Scan A coronary calcium scan is an imaging test used to look for deposits of plaque in the inner lining of the blood vessels of the heart (coronary arteries). Plaque is made up of calcium, protein, and fatty substances. These deposits of plaque can partly clog and narrow the coronary arteries without producing any symptoms or warning signs.  This puts a person at risk for a heart attack. This test is recommended for people who are at moderate risk for heart disease. The test can find plaque deposits before symptoms develop. Tell a health care provider about:  Any allergies you have.  All medicines you are taking, including vitamins, herbs, eye drops, creams, and over-the-counter medicines.  Any problems you or family members have had with anesthetic medicines.  Any blood disorders you have.  Any surgeries you have had.  Any medical conditions you have.  Whether you are pregnant or may be pregnant. What are the risks? Generally, this is a safe procedure. However, problems may occur, including:  Harm to a pregnant woman and her unborn baby. This test involves the use of radiation. Radiation exposure can be dangerous to a pregnant woman and her unborn baby. If you are pregnant or think you may be pregnant, you should not have this procedure done.  Slight increase in the risk of cancer. This is because of the radiation involved in the test. What happens before the procedure? Ask your health care provider for any specific instructions on how to prepare for this procedure. You may be asked to avoid products that contain caffeine, tobacco, or nicotine for 4 hours before the procedure. What happens during the procedure?   You will undress and remove any jewelry from your neck or chest.  You will put on a hospital gown.  Sticky electrodes will be placed on your chest. The electrodes will be connected to an electrocardiogram (ECG) machine to record a tracing of the electrical activity of your heart.  You will lie down on a curved bed that is attached to the [place].  You may be given medicine to slow down your heart rate so that clear pictures can be created.  You will be moved into the CT scanner, and the CT scanner will take pictures of your heart. During this time, you will be asked to lie still and hold your breath for  2-3 seconds at a time while each picture of your heart is being taken. The procedure may vary among health care providers and hospitals. What happens after the procedure?  You can get dressed.  You can return to your normal activities.  It is up to you to get the results of your procedure. Ask your health care provider, or the department that is doing the procedure, when your results will be ready. Summary  A coronary calcium scan is an imaging test used to look for deposits of plaque in the inner lining of the blood vessels of the heart (coronary arteries). Plaque is made up of calcium, protein, and fatty substances.  Generally, this is a safe procedure. Tell your health care provider if you are pregnant or may be pregnant.  Ask your health care provider for any specific instructions on how to prepare for this procedure.  A CT scanner will take pictures of your heart.  You can return to your normal activities after the scan is done. This information is not intended to replace advice given to you by your health care provider. Make  sure you discuss any questions you have with your health care provider. Document Revised: 03/02/2019 Document Reviewed: 03/02/2019 Elsevier Patient Education  [place].  Echocardiogram An echocardiogram is a procedure that uses painless sound waves (ultrasound) to produce an image of the heart. Images from an echocardiogram can provide important information about:  Signs of coronary artery disease (CAD).  Aneurysm detection. An aneurysm is a weak or damaged part of an artery wall that bulges out from the normal force of blood pumping through the body.  Heart size and shape. Changes in the size or shape of the heart can be associated with certain conditions, including heart failure, aneurysm, and CAD.  Heart muscle function.  Heart valve function.  Signs of a past heart attack.  Fluid buildup around the heart.  Thickening of the heart  muscle.  A tumor or infectious growth around the heart valves. Tell a health care provider about:  Any allergies you have.  All medicines you are taking, including vitamins, herbs, eye drops, creams, and over-the-counter medicines.  Any blood disorders you have.  Any surgeries you have had.  Any medical conditions you have.  Whether you are pregnant or may be pregnant. What are the risks? Generally, this is a safe procedure. However, problems may occur, including:  Allergic reaction to dye (contrast) that may be used during the procedure. What happens before the procedure? No specific preparation is needed. You may eat and drink normally. What happens during the procedure?   An IV tube may be inserted into one of your veins.  You may receive contrast through this tube. A contrast is an injection that improves the quality of the pictures from your heart.  A gel will be applied to your chest.  A wand-like tool (transducer) will be moved over your chest. The gel will help to transmit the sound waves from the transducer.  The sound waves will harmlessly bounce off of your heart to allow the heart images to be captured in real-time motion. The images will be recorded on a computer. The procedure may vary among health care providers and hospitals. What happens after the procedure?  You may return to your normal, everyday life, including diet, activities, and medicines, unless your health care provider tells you not to do that. Summary  An echocardiogram is a procedure that uses painless sound waves (ultrasound) to produce an image of the heart.  Images from an echocardiogram can provide important information about the size and shape of your heart, heart muscle function, heart valve function, and fluid buildup around your heart.  You do not need to do anything to prepare before this procedure. You may eat and drink normally.  After the echocardiogram is completed, you may  return to your normal, everyday life, unless your health care provider tells you not to do that. This information is not intended to replace advice given to you by your health care provider. Make sure you discuss any questions you have with your health care provider. Document Revised: 12/03/2018 Document Reviewed: 09/14/2016 Elsevier Patient Education  [place].

## 2020-02-17 NOTE — Progress Notes (Addendum)
Cardiology Office Note:    Date:  02/17/2020   ID:  Jessica Robinson, DOB February 15, 1971, MRN [id number]  PCP:  Manfred Shirts, PA  Cardiologist:  Jenean Lindau, MD   Referring MD: Manfred Shirts, PA    ASSESSMENT:    1. Palpitations   2. Hyperthyroidism   3. Cardiac murmur    PLAN:    In order of problems listed above:  1. Primary prevention stressed with the patient.  Importance of compliance with diet medication stressed and she vocalized understanding.  Her blood pressure is stable. 2. Palpitations: I reassured the patient about my findings.  Her TSH is low.  She needs to be evaluated for hyperthyroidism and this may be a cause of her palpitations.  I discussed this with her and she will talk to her primary care physician about this.  We will do a 2-week event monitoring to understand her symptoms.  She has bradycardia so I will not initiate her on a beta-blocker at this time. 3. Cardiac murmur: Echocardiogram will be done to assess murmur heard on auscultation. 4. Patient will be seen in follow-up appointment in 6 months or earlier if the patient has any concerns 5. She knows to go to nearest emergency room for any concerning symptoms.  Patient had multiple questions which were answered to her satisfaction.  Addendum: I realized that now I had received a large stack of paper says her medical records and the blood work report belonged to another patient.  I subsequently realized this mistake and went back to the patient and discussed her lab work including lipids and other blood work and clarified this error.  Questions were answered to her satisfaction.   Medication Adjustments/Labs and Tests Ordered: Current medicines are reviewed at length with the patient today.  Concerns regarding medicines are outlined above.  No orders of the defined types were placed in this encounter.  No orders of the defined types were placed in this encounter.    History of Present Illness:     Jessica Robinson is a 49 y.o. female who is being seen today for the evaluation of palpitations at the request of Beane, Lori M, [place].  Patient is a pleasant 49 year old female she has past medical history that is not very significant from a cardiovascular standpoint.  She recently had her lipids checked and evaluated.  She denies any chest pain orthopnea or PND.  She is an active lady.  She mentions to me that she had palpitations and fast heartbeats on 4 3 occasions recently.  This was confirmed by her smart watch.  She does not have the watch with her today.  No orthopnea PND.  After that she feels drained.  At the time of my evaluation, the patient is alert awake oriented and in no distress.  No syncope.  For this reason she was sent here for evaluation.  Past Medical History:  Diagnosis Date  . Arthritis    back  . Complication of anesthesia    states heart rate always drops; has resting heart rate in 60s  . History of migraine   . PONV (postoperative nausea and vomiting)   . Right flank mass 07/2016    Past Surgical History:  Procedure Laterality Date  . ABDOMINAL HYSTERECTOMY     complete  . AUGMENTATION MAMMAPLASTY Bilateral 2003  . BICEPS TENODESIS Left 06/27/2011  . CHOLECYSTECTOMY N/A 11/20/2012   Procedure: LAPAROSCOPIC CHOLECYSTECTOMY possible IOC ;  Surgeon: Harl Bowie, MD;  Location: [facility] OR;  Service: General;  Laterality: N/A;  . ERCP N/A 11/27/2012   Procedure: ENDOSCOPIC RETROGRADE CHOLANGIOPANCREATOGRAPHY (ERCP);  Surgeon: Beryle Beams, MD;  Location: [facility] ENDOSCOPY;  Service: Endoscopy;  Laterality: N/A;  . FOOT NEUROMA SURGERY Right ~ 2008  . HIP ARTHROSCOPY Left 08/23/2014  . KNEE ARTHROSCOPY Left 03/15/2014  . SHOULDER ARTHROSCOPY W/ ROTATOR CUFF REPAIR Right 06/27/2011  . WOUND EXPLORATION Right 08/23/2016   Procedure: RIGHT FLANK EXPLORATION;  Surgeon: Coralie Keens, MD;  Location: [place];  Service: General;  Laterality: Right;     Current Medications: No outpatient medications have been marked as taking for the 02/17/20 encounter (Office Visit) with Zylee Marchiano, Reita Cliche, MD.     Allergies:   Meclizine, Adhesive [tape], Erythromycin, Hydrocodone-acetaminophen, and Latex   Social History   Socioeconomic History  . Marital status: Married    Spouse name: Not on file  . Number of children: Not on file  . Years of education: Not on file  . Highest education level: Not on file  Occupational History  . Not on file  Tobacco Use  . Smoking status: Never Smoker  . Smokeless tobacco: Never Used  Substance and Sexual Activity  . Alcohol use: Yes    Comment: occasionally  . Drug use: No  . Sexual activity: Yes  Other Topics Concern  . Not on file  Social History Narrative  . Not on file   Social Determinants of Health   Financial Resource Strain:   . Difficulty of Paying Living Expenses:   Food Insecurity:   . Worried About Charity fundraiser in the Last Year:   . Arboriculturist in the Last Year:   Transportation Needs:   . Film/video editor (Medical):   Marland Kitchen Lack of Transportation (Non-Medical):   Physical Activity:   . Days of Exercise per Week:   . Minutes of Exercise per Session:   Stress:   . Feeling of Stress :   Social Connections:   . Frequency of Communication with Friends and Family:   . Frequency of Social Gatherings with Friends and Family:   . Attends Religious Services:   . Active Member of Clubs or Organizations:   . Attends Archivist Meetings:   Marland Kitchen Marital Status:      Family History: The patient's family history includes Breast cancer in her cousin, paternal aunt, and paternal grandmother; Cancer in her father, paternal aunt, paternal grandmother, and sister.  ROS:   Please see the history of present illness.    All other systems reviewed and are negative.  EKGs/Labs/Other Studies Reviewed:    The following studies were reviewed today: EKG reveals sinus rhythm  and nonspecific ST-T changes   Recent Labs: No results found for requested labs within last 8760 hours.  Recent Lipid Panel No results found for: CHOL, TRIG, HDL, CHOLHDL, VLDL, LDLCALC, LDLDIRECT  Physical Exam:    VS:  BP 118/84   Pulse 68   Ht 5\' 5"  (1.651 m)   Wt 183 lb (83 kg)   SpO2 97%   BMI 30.45 kg/m     Wt Readings from Last 3 Encounters:  02/17/20 183 lb (83 kg)  08/25/16 177 lb (80.3 kg)  08/23/16 172 lb 9.6 oz (78.3 kg)     GEN: Patient is in no acute distress HEENT: Normal NECK: No JVD; No carotid bruits LYMPHATICS: No lymphadenopathy CARDIAC: S1 S2 regular, 2/6 systolic murmur at the apex. RESPIRATORY:  Clear  to auscultation without rales, wheezing or rhonchi  ABDOMEN: Soft, non-tender, non-distended MUSCULOSKELETAL:  No edema; No deformity  SKIN: Warm and dry NEUROLOGIC:  Alert and oriented x 3 PSYCHIATRIC:  Normal affect    Signed, Jenean Lindau, MD  02/17/2020 4:17 PM    [facility]

## 2020-03-16 ENCOUNTER — Ambulatory Visit: Payer: BC Managed Care – PPO | Admitting: Cardiology

## 2020-05-19 ENCOUNTER — Telehealth: Payer: Self-pay | Admitting: *Deleted

## 2020-05-19 ENCOUNTER — Other Ambulatory Visit: Payer: Self-pay

## 2020-05-19 ENCOUNTER — Ambulatory Visit (INDEPENDENT_AMBULATORY_CARE_PROVIDER_SITE_OTHER): Payer: BC Managed Care – PPO | Admitting: Cardiology

## 2020-05-19 VITALS — BP 112/77 | HR 74 | Ht 65.0 in | Wt 183.4 lb

## 2020-05-19 DIAGNOSIS — M1990 Unspecified osteoarthritis, unspecified site: Secondary | ICD-10-CM | POA: Insufficient documentation

## 2020-05-19 DIAGNOSIS — R600 Localized edema: Secondary | ICD-10-CM | POA: Diagnosis not present

## 2020-05-19 DIAGNOSIS — T8859XA Other complications of anesthesia, initial encounter: Secondary | ICD-10-CM | POA: Insufficient documentation

## 2020-05-19 DIAGNOSIS — R5383 Other fatigue: Secondary | ICD-10-CM | POA: Diagnosis not present

## 2020-05-19 DIAGNOSIS — R400 Somnolence: Secondary | ICD-10-CM

## 2020-05-19 DIAGNOSIS — Z8669 Personal history of other diseases of the nervous system and sense organs: Secondary | ICD-10-CM | POA: Insufficient documentation

## 2020-05-19 DIAGNOSIS — Z9889 Other specified postprocedural states: Secondary | ICD-10-CM | POA: Insufficient documentation

## 2020-05-19 NOTE — Telephone Encounter (Signed)
-----   Message from Mendel Ryder, [place] sent at 05/19/2020 10:20 AM EDT ----- Regarding: Sleep study Pt needs sleep study.   Dx: Daytime sleepiness   Thank you

## 2020-05-19 NOTE — Progress Notes (Signed)
Cardiology Office Note:    Date:  05/19/2020   ID:  Jessica Robinson, DOB Jul 20, 1971, MRN [id number]  PCP:  Manfred Shirts, PA  Cardiologist:  Berniece Salines, DO  Electrophysiologist:  None   Referring MD: Manfred Shirts, PA   Follow-up visit  History of Present Illness:    Jessica Robinson is a 49 y.o. female with a hx of palpitations who presented on July 24 to be evaluated for palpitations.  She did see Dr. Geraldo Pitter at that time.  During her visit a monitor was placed on the patient as well as an echocardiogram was recommended.  The patient is here today she tells me that she has been experiencing significantly low heart rate with her heart rate going into the 40s.  She also mentioned that she is feeling quite a bit fatigued.  She notes that she is experiencing daytime sleepiness where when she sleeps at night and never seems like she is getting enough sleep.  She is tired when she wakes up sometimes and when she goes to bed.  The low heart rate is bothersome but she does need experiencing lightheadedness or dizziness.  She was an avid runner for 15 years she tells me.  Blood work done at her PCP in June 2021 shows WBC 4.2, hemoglobin 14.2, hematocrit 42.2, platelet 264 Lipid profile triglycerides 76, total cholesterol 171, HDL 64, LDL 92. TSH 133.  She also does have vitamin D and B12 deficiency which she is being treated for.   Past Medical History:  Diagnosis Date  . Abdominal pain, right upper quadrant 11/28/2012  . ADD (attention deficit disorder) 05/06/2016  . Adjustment disorder with anxiety 01/04/2014   Last Assessment & Plan:  Formatting of this note might be different from the original. Psychological condition is worsening. Regular aerobic exercise. Psychological condition  will be reassessed in 4 weeks. Start Zoloft  . Anxiety disorder 06/18/2014  . Arthritis    back  . Bursitis of left hip 06/15/2014  . Cardiac murmur 02/17/2020  . Chronic cholecystitis 10/27/2012  . Chronic  lower back pain 06/18/2014  . Chronic tension-type headache 06/18/2014  . Complication of anesthesia    states heart rate always drops; has resting heart rate in 60s  . Diverticulosis 06/18/2014  . Generalized abdominal pain 06/26/2016  . Heart palpitations 01/21/2017  . History of migraine   . Hyperthyroidism 02/17/2020  . Insomnia 01/04/2014   Last Assessment & Plan:  Formatting of this note might be different from the original. Pt has Restoril at home and uses it prn  . Irritable bowel syndrome 06/18/2014  . Lumbosacral or thoracic radiculopathy 06/18/2014  . Menopausal state 06/30/2015  . Nonspecific (abnormal) findings on radiological and other examination of gastrointestinal tract 11/28/2012  . Overweight 06/18/2014  . Palpitations 02/17/2020  . Pes anserinus tendinitis or bursitis 06/15/2014  . PONV (postoperative nausea and vomiting)   . Right flank mass 07/2016  . Symptomatic menopausal or female climacteric states 06/18/2014  . Tachycardia 01/21/2017  . Tear of medial meniscus of left knee 01/24/2014  . Vitamin B12 deficiency 06/30/2015    Past Surgical History:  Procedure Laterality Date  . ABDOMINAL HYSTERECTOMY     complete  . AUGMENTATION MAMMAPLASTY Bilateral 2003  . BICEPS TENODESIS Left 06/27/2011  . CHOLECYSTECTOMY N/A 11/20/2012   Procedure: LAPAROSCOPIC CHOLECYSTECTOMY possible IOC ;  Surgeon: Harl Bowie, MD;  Location: [place];  Service: General;  Laterality: N/A;  . ERCP N/A 11/27/2012   Procedure:  ENDOSCOPIC RETROGRADE CHOLANGIOPANCREATOGRAPHY (ERCP);  Surgeon: Beryle Beams, MD;  Location: [facility] ENDOSCOPY;  Service: Endoscopy;  Laterality: N/A;  . FOOT NEUROMA SURGERY Right ~ 2008  . HIP ARTHROSCOPY Left 08/23/2014  . KNEE ARTHROSCOPY Left 03/15/2014  . SHOULDER ARTHROSCOPY W/ ROTATOR CUFF REPAIR Right 06/27/2011  . WOUND EXPLORATION Right 08/23/2016   Procedure: RIGHT FLANK EXPLORATION;  Surgeon: Coralie Keens, MD;  Location: [place];   Service: General;  Laterality: Right;    Current Medications: Current Meds  Medication Sig  . Ascorbic Acid (VITAMIN C) 1000 MG tablet Take 1,000 mg by mouth daily.  . Cyanocobalamin (VITAMIN B 12 PO) Take by mouth.  . hydrochlorothiazide (HYDRODIURIL) 25 MG tablet Take 12.5 mg by mouth daily.  Marland Kitchen VITAMIN D PO Take by mouth daily.     Allergies:   Meclizine, Adhesive [tape], Erythromycin, Hydrocodone-acetaminophen, and Latex   Social History   Socioeconomic History  . Marital status: Married    Spouse name: Not on file  . Number of children: Not on file  . Years of education: Not on file  . Highest education level: Not on file  Occupational History  . Not on file  Tobacco Use  . Smoking status: Never Smoker  . Smokeless tobacco: Never Used  Substance and Sexual Activity  . Alcohol use: Yes    Comment: occasionally  . Drug use: No  . Sexual activity: Yes  Other Topics Concern  . Not on file  Social History Narrative  . Not on file   Social Determinants of Health   Financial Resource Strain:   . Difficulty of Paying Living Expenses: Not on file  Food Insecurity:   . Worried About Charity fundraiser in the Last Year: Not on file  . Ran Out of Food in the Last Year: Not on file  Transportation Needs:   . Lack of Transportation (Medical): Not on file  . Lack of Transportation (Non-Medical): Not on file  Physical Activity:   . Days of Exercise per Week: Not on file  . Minutes of Exercise per Session: Not on file  Stress:   . Feeling of Stress : Not on file  Social Connections:   . Frequency of Communication with Friends and Family: Not on file  . Frequency of Social Gatherings with Friends and Family: Not on file  . Attends Religious Services: Not on file  . Active Member of Clubs or Organizations: Not on file  . Attends Archivist Meetings: Not on file  . Marital Status: Not on file     Family History: The patient's family history includes Breast  cancer in her cousin, paternal aunt, and paternal grandmother; Cancer in her father, paternal aunt, paternal grandmother, and sister.  ROS:   Review of Systems  Constitution: Negative for decreased appetite, fever and weight gain.  HENT: Negative for congestion, ear discharge, hoarse voice and sore throat.   Eyes: Negative for discharge, redness, vision loss in right eye and visual halos.  Cardiovascular: Negative for chest pain, dyspnea on exertion, leg swelling, orthopnea and palpitations.  Respiratory: Negative for cough, hemoptysis, shortness of breath and snoring.   Endocrine: Negative for heat intolerance and polyphagia.  Hematologic/Lymphatic: Negative for bleeding problem. Does not bruise/bleed easily.  Skin: Negative for flushing, nail changes, rash and suspicious lesions.  Musculoskeletal: Negative for arthritis, joint pain, muscle cramps, myalgias, neck pain and stiffness.  Gastrointestinal: Negative for abdominal pain, bowel incontinence, diarrhea and excessive appetite.  Genitourinary: Negative for  decreased libido, genital sores and incomplete emptying.  Neurological: Negative for brief paralysis, focal weakness, headaches and loss of balance.  Psychiatric/Behavioral: Negative for altered mental status, depression and suicidal ideas.  Allergic/Immunologic: Negative for HIV exposure and persistent infections.    EKGs/Labs/Other Studies Reviewed:    The following studies were reviewed today:   EKG: None today  Zio monitor Baseline rhythm: Sinus rhythm Minimum heart rate: 47 BPM.  Average heart rate: 74 BPM.  Maximal heart rate 149 BPM. Atrial arrhythmia: Rare PACs Ventricular arrhythmia: Rare PVCs Conduction abnormality: None significant Symptoms: None  Recent Labs: No results found for requested labs within last 8760 hours.  Recent Lipid Panel No results found for: CHOL, TRIG, HDL, CHOLHDL, VLDL, LDLCALC, LDLDIRECT  Physical Exam:    VS:  BP 112/77    Pulse 74   Ht 5\' 5"  (1.651 m)   Wt 183 lb 6.4 oz (83.2 kg)   SpO2 95%   BMI 30.52 kg/m     Wt Readings from Last 3 Encounters:  05/19/20 183 lb 6.4 oz (83.2 kg)  02/17/20 183 lb (83 kg)  08/25/16 177 lb (80.3 kg)     GEN: Well nourished, well developed in no acute distress HEENT: Normal NECK: No JVD; No carotid bruits LYMPHATICS: No lymphadenopathy CARDIAC: S1S2 noted,RRR, no murmurs, rubs, gallops RESPIRATORY:  Clear to auscultation without rales, wheezing or rhonchi  ABDOMEN: Soft, non-tender, non-distended, +bowel sounds, no guarding. EXTREMITIES: No edema, No cyanosis, no clubbing MUSCULOSKELETAL:  No deformity  SKIN: Warm and dry NEUROLOGIC:  Alert and oriented x 3, non-focal PSYCHIATRIC:  Normal affect, good insight  ASSESSMENT:    1. Fatigue, unspecified type   2. Daytime sleepiness   3. Leg edema    PLAN:     Her fatigue with daytime somnolence is bothersome and concerning for sleep apnea.  Therefore like to have the patient undergo a sleep study.  We discussed this.  And she is agreeable to proceed with this.  For completeness for fatigue, going to get an echocardiogram to assess LV function and any other valvular abnormalities.  She is on hydrochlorothiazide for bilateral leg edema.  The patient understands the need to lose weight with diet and exercise. We have discussed specific strategies for this.  The patient is in agreement with the above plan. The patient left the office in stable condition.  The patient will follow up in 3 months or sooner if needed   Medication Adjustments/Labs and Tests Ordered: Current medicines are reviewed at length with the patient today.  Concerns regarding medicines are outlined above.  Orders Placed This Encounter  Procedures  . ECHOCARDIOGRAM COMPLETE  . Split night study   No orders of the defined types were placed in this encounter.   Patient Instructions  Medication Instructions:  Your physician recommends that  you continue on your current medications as directed. Please refer to the Current Medication list given to you today.  *If you need a refill on your cardiac medications before your next appointment, please call your pharmacy*   Lab Work: None ordered   If you have labs (blood work) drawn today and your tests are completely normal, you will receive your results only by: Marland Kitchen MyChart Message (if you have MyChart) OR . A paper copy in the mail If you have any lab test that is abnormal or we need to change your treatment, we will call you to review the results.   Testing/Procedures: Your physician has requested that you have an  echocardiogram. Echocardiography is a painless test that uses sound waves to create images of your heart. It provides your doctor with information about the size and shape of your heart and how well your heart's chambers and valves are working. This procedure takes approximately one hour. There are no restrictions for this procedure.  Your physician has recommended that you have a sleep study. This test records several body functions during sleep, including: brain activity, eye movement, oxygen and carbon dioxide blood levels, heart rate and rhythm, breathing rate and rhythm, the flow of air through your mouth and nose, snoring, body muscle movements, and chest and belly movement.    Follow-Up: At [facility], you and your health needs are our priority.  As part of our continuing mission to provide you with exceptional heart care, we have created designated Provider Care Teams.  These Care Teams include your primary Cardiologist (physician) and Advanced Practice Providers (APPs -  Physician Assistants and Nurse Practitioners) who all work together to provide you with the care you need, when you need it.  We recommend signing up for the patient portal called "MyChart".  Sign up information is provided on this After Visit Summary.  MyChart is used to connect with patients  for Virtual Visits (Telemedicine).  Patients are able to view lab/test results, encounter notes, upcoming appointments, etc.  Non-urgent messages can be sent to your provider as well.   To learn more about what you can do with MyChart, go to [email or web address].    Your next appointment:   3 month(s)  The format for your next appointment:   In Person  Provider:   Berniece Salines, DO   Other Instructions None      Adopting a Healthy Lifestyle.  Know what a healthy weight is for you (roughly BMI <25) and aim to maintain this   Aim for 7+ servings of fruits and vegetables daily   65-80+ fluid ounces of water or unsweet tea for healthy kidneys   Limit to max 1 drink of alcohol per day; avoid smoking/tobacco   Limit animal fats in diet for cholesterol and heart health - choose grass fed whenever available   Avoid highly processed foods, and foods high in saturated/trans fats   Aim for low stress - take time to unwind and care for your mental health   Aim for 150 min of moderate intensity exercise weekly for heart health, and weights twice weekly for bone health   Aim for 7-9 hours of sleep daily   When it comes to diets, agreement about the perfect plan isnt easy to find, even among the experts. Experts at the [employer] developed an idea known as the Healthy Eating Plate. Just imagine a plate divided into logical, healthy portions.   The emphasis is on diet quality:   Load up on vegetables and fruits - one-half of your plate: Aim for color and variety, and remember that potatoes dont count.   Go for whole grains - one-quarter of your plate: Whole wheat, barley, wheat berries, quinoa, oats, brown rice, and foods made with them. If you want pasta, go with whole wheat pasta.   Protein power - one-quarter of your plate: Fish, chicken, beans, and nuts are all healthy, versatile protein sources. Limit red meat.   The diet, however, does go beyond the  plate, offering a few other suggestions.   Use healthy plant oils, such as olive, canola, soy, corn, sunflower and peanut. Check the labels, and avoid  partially hydrogenated oil, which have unhealthy trans fats.   If youre thirsty, drink water. Coffee and tea are good in moderation, but skip sugary drinks and limit milk and dairy products to one or two daily servings.   The type of carbohydrate in the diet is more important than the amount. Some sources of carbohydrates, such as vegetables, fruits, whole grains, and beans-are healthier than others.   Finally, stay active  Signed, Berniece Salines, DO  05/19/2020 10:16 AM    [place]

## 2020-05-19 NOTE — Patient Instructions (Signed)
Medication Instructions:  Your physician recommends that you continue on your current medications as directed. Please refer to the Current Medication list given to you today.  *If you need a refill on your cardiac medications before your next appointment, please call your pharmacy*   Lab Work: None ordered   If you have labs (blood work) drawn today and your tests are completely normal, you will receive your results only by:  [place] (if you have MyChart) OR  A paper copy in the mail If you have any lab test that is abnormal or we need to change your treatment, we will call you to review the results.   Testing/Procedures: Your physician has requested that you have an echocardiogram. Echocardiography is a painless test that uses sound waves to create images of your heart. It provides your doctor with information about the size and shape of your heart and how well your hearts chambers and valves are working. This procedure takes approximately one hour. There are no restrictions for this procedure.  Your physician has recommended that you have a sleep study. This test records several body functions during sleep, including: brain activity, eye movement, oxygen and carbon dioxide blood levels, heart rate and rhythm, breathing rate and rhythm, the flow of air through your mouth and nose, snoring, body muscle movements, and chest and belly movement.    Follow-Up: At [facility], you and your health needs are our priority.  As part of our continuing mission to provide you with exceptional heart care, we have created designated Provider Care Teams.  These Care Teams include your primary Cardiologist (physician) and Advanced Practice Providers (APPs -  Physician Assistants and Nurse Practitioners) who all work together to provide you with the care you need, when you need it.  We recommend signing up for the patient portal called "MyChart".  Sign up information is provided on this After  Visit Summary.  MyChart is used to connect with patients for Virtual Visits (Telemedicine).  Patients are able to view lab/test results, encounter notes, upcoming appointments, etc.  Non-urgent messages can be sent to your provider as well.   To learn more about what you can do with MyChart, go to [email or web address].    Your next appointment:   3 month(s)  The format for your next appointment:   In Person  Provider:   Berniece Salines, DO   Other Instructions None

## 2020-05-31 ENCOUNTER — Other Ambulatory Visit: Payer: Self-pay | Admitting: Cardiology

## 2020-05-31 DIAGNOSIS — R400 Somnolence: Secondary | ICD-10-CM

## 2020-06-01 ENCOUNTER — Telehealth: Payer: Self-pay | Admitting: *Deleted

## 2020-06-01 NOTE — Telephone Encounter (Signed)
Patient notified [employer] denied of in lab sleep study. HST approved. She states that she plans to call her insurance to  See how much her out of pocket will be. If it is too much she may plan not to have this done. [facility] sleep disorders contact phone number given to patient if she decides to cancel. She was also told that if she cancels the sleep appointment she is to call and inform Dr Harriet Masson. Patient voiced understanding.

## 2020-06-09 ENCOUNTER — Ambulatory Visit (INDEPENDENT_AMBULATORY_CARE_PROVIDER_SITE_OTHER): Payer: BC Managed Care – PPO

## 2020-06-09 ENCOUNTER — Other Ambulatory Visit: Payer: Self-pay

## 2020-06-09 DIAGNOSIS — R5383 Other fatigue: Secondary | ICD-10-CM

## 2020-06-09 DIAGNOSIS — R600 Localized edema: Secondary | ICD-10-CM

## 2020-06-09 LAB — ECHOCARDIOGRAM COMPLETE
Area-P 1/2: 3.85 cm2
S' Lateral: 2.2 cm

## 2020-06-09 NOTE — Progress Notes (Signed)
Complete echocardiogram performed.  Jimmy Harsimran Westman RDCS, RVT  

## 2020-06-12 ENCOUNTER — Telehealth: Payer: Self-pay

## 2020-06-12 NOTE — Telephone Encounter (Signed)
Left message on patients voicemail to please return our call.   

## 2020-06-12 NOTE — Telephone Encounter (Signed)
-----   Message from Berniece Salines, DO sent at 06/09/2020  1:47 PM EDT ----- Normal echo

## 2020-07-06 ENCOUNTER — Encounter (HOSPITAL_BASED_OUTPATIENT_CLINIC_OR_DEPARTMENT_OTHER): Payer: BC Managed Care – PPO | Admitting: Cardiovascular Disease

## 2020-07-27 ENCOUNTER — Other Ambulatory Visit: Payer: Self-pay | Admitting: General Practice

## 2020-07-27 DIAGNOSIS — Z1231 Encounter for screening mammogram for malignant neoplasm of breast: Secondary | ICD-10-CM

## 2020-07-28 ENCOUNTER — Encounter (HOSPITAL_BASED_OUTPATIENT_CLINIC_OR_DEPARTMENT_OTHER): Payer: BC Managed Care – PPO | Admitting: Cardiovascular Disease

## 2020-08-10 ENCOUNTER — Ambulatory Visit: Payer: BC Managed Care – PPO | Admitting: Cardiology

## 2020-09-08 ENCOUNTER — Ambulatory Visit: Payer: BC Managed Care – PPO

## 2020-10-10 ENCOUNTER — Ambulatory Visit
Admission: RE | Admit: 2020-10-10 | Discharge: 2020-10-10 | Disposition: A | Discharge status: Home / Self Care | Payer: BC Managed Care – PPO | Source: Ambulatory Visit | Attending: General Practice | Admitting: General Practice

## 2020-10-10 ENCOUNTER — Other Ambulatory Visit: Payer: Self-pay | Admitting: General Practice

## 2020-10-10 ENCOUNTER — Other Ambulatory Visit: Payer: Self-pay

## 2020-10-10 DIAGNOSIS — Z1231 Encounter for screening mammogram for malignant neoplasm of breast: Secondary | ICD-10-CM

## 2021-09-18 ENCOUNTER — Other Ambulatory Visit: Payer: Self-pay | Admitting: General Practice

## 2021-09-18 DIAGNOSIS — N644 Mastodynia: Secondary | ICD-10-CM

## 2021-10-10 ENCOUNTER — Ambulatory Visit
Admission: RE | Admit: 2021-10-10 | Discharge: 2021-10-10 | Disposition: A | Discharge status: Home / Self Care | Payer: BC Managed Care – PPO | Source: Ambulatory Visit | Attending: General Practice | Admitting: General Practice

## 2021-10-10 DIAGNOSIS — N644 Mastodynia: Secondary | ICD-10-CM

## 2021-10-18 ENCOUNTER — Other Ambulatory Visit: Payer: BC Managed Care – PPO

## 2022-04-20 ENCOUNTER — Emergency Department (HOSPITAL_BASED_OUTPATIENT_CLINIC_OR_DEPARTMENT_OTHER)
Admission: EM | Admit: 2022-04-20 | Discharge: 2022-04-21 | Disposition: A | Discharge status: Home / Self Care | Payer: BC Managed Care – PPO | Attending: Emergency Medicine | Admitting: Emergency Medicine

## 2022-04-20 ENCOUNTER — Encounter (HOSPITAL_BASED_OUTPATIENT_CLINIC_OR_DEPARTMENT_OTHER): Payer: Self-pay | Admitting: Emergency Medicine

## 2022-04-20 ENCOUNTER — Emergency Department (HOSPITAL_BASED_OUTPATIENT_CLINIC_OR_DEPARTMENT_OTHER): Payer: BC Managed Care – PPO

## 2022-04-20 DIAGNOSIS — R0602 Shortness of breath: Secondary | ICD-10-CM | POA: Diagnosis present

## 2022-04-20 DIAGNOSIS — Z91040 Latex allergy status: Secondary | ICD-10-CM | POA: Insufficient documentation

## 2022-04-20 LAB — CBC WITH DIFFERENTIAL/PLATELET
Abs Immature Granulocytes: 0 10*3/uL (ref 0.00–0.07)
Basophils Absolute: 0 10*3/uL (ref 0.0–0.1)
Basophils Relative: 1 %
Eosinophils Absolute: 0.2 10*3/uL (ref 0.0–0.5)
Eosinophils Relative: 4 %
HCT: 39.7 % (ref 36.0–46.0)
Hemoglobin: 13.3 g/dL (ref 12.0–15.0)
Immature Granulocytes: 0 %
Lymphocytes Relative: 45 %
Lymphs Abs: 2.2 10*3/uL (ref 0.7–4.0)
MCH: 29 pg (ref 26.0–34.0)
MCHC: 33.5 g/dL (ref 30.0–36.0)
MCV: 86.5 fL (ref 80.0–100.0)
Monocytes Absolute: 0.4 10*3/uL (ref 0.1–1.0)
Monocytes Relative: 8 %
Neutro Abs: 2 10*3/uL (ref 1.7–7.7)
Neutrophils Relative %: 42 %
Platelets: 298 10*3/uL (ref 150–400)
RBC: 4.59 MIL/uL (ref 3.87–5.11)
RDW: 13.4 % (ref 11.5–15.5)
WBC: 4.9 10*3/uL (ref 4.0–10.5)
nRBC: 0 % (ref 0.0–0.2)

## 2022-04-20 LAB — D-DIMER, QUANTITATIVE: D-Dimer, Quant: 0.47 ug/mL-FEU (ref 0.00–0.50)

## 2022-04-20 NOTE — ED Triage Notes (Signed)
Pt reports SHOB x few weeks; reports rib cage pain with deep breath

## 2022-04-21 ENCOUNTER — Encounter (HOSPITAL_BASED_OUTPATIENT_CLINIC_OR_DEPARTMENT_OTHER): Payer: Self-pay

## 2022-04-21 ENCOUNTER — Emergency Department (HOSPITAL_BASED_OUTPATIENT_CLINIC_OR_DEPARTMENT_OTHER): Payer: BC Managed Care – PPO

## 2022-04-21 LAB — BASIC METABOLIC PANEL
Anion gap: 7 (ref 5–15)
BUN: 18 mg/dL (ref 6–20)
CO2: 24 mmol/L (ref 22–32)
Calcium: 9 mg/dL (ref 8.9–10.3)
Chloride: 108 mmol/L (ref 98–111)
Creatinine, Ser: 0.81 mg/dL (ref 0.44–1.00)
GFR, Estimated: >60 mL/min (ref >60)
Glucose, Bld: 111 mg/dL — ABNORMAL HIGH (ref 70–99)
Potassium: 3.5 mmol/L (ref 3.5–5.1)
Sodium: 139 mmol/L (ref 135–145)

## 2022-04-21 MED ORDER — IOHEXOL 350 MG/ML SOLN
75.0000 mL | Freq: Once | INTRAVENOUS | Status: AC | PRN
  Administered 2022-04-21: 75 mL via INTRAVENOUS

## 2022-04-21 NOTE — ED Provider Notes (Signed)
[facility] DEPT [facility] Provider Note: Jessica Spurling, MD, FACEP  CSN: [id number] MRN: [id number] ARRIVAL: 04/20/22 at [place]: [place]  04/21/22 12:11 AM Jessica Robinson is a 51 y.o. female who travels frequently by air.  About a month ago she started feeling short of breath and having general malaise.  It is somewhat worse with shortness of breath.  She is also having some right upper posterior chest discomfort when she takes a deep breath.  She has noted her oxygen saturation to drop in the low 90s at times although it was 100% on room air on arrival.  She was seen by her PCP several days ago which showed:    She denies fever or chills.  She does have generalized weakness but states she has not allowed this to slow her down.  Past Medical History:  Diagnosis Date   Abdominal pain, right upper quadrant 11/28/2012   ADD (attention deficit disorder) 05/06/2016   Adjustment disorder with anxiety 01/04/2014   Last Assessment & Plan:  Formatting of this note might be different from the original. Psychological condition is worsening. Regular aerobic exercise. Psychological condition  will be reassessed in 4 weeks. Start Zoloft   Anxiety disorder 06/18/2014   Arthritis    back   Bursitis of left hip 06/15/2014   Cardiac murmur 02/17/2020   Chronic cholecystitis 10/27/2012   Chronic lower back pain 06/18/2014   Chronic tension-type headache 78/29/5621   Complication of anesthesia    states heart rate always drops; has resting heart rate in 60s   Diverticulosis 06/18/2014   Generalized abdominal pain 06/26/2016   Heart palpitations 01/21/2017   History of migraine    Hyperthyroidism 02/17/2020   Insomnia 01/04/2014   Last Assessment & Plan:  Formatting of this note might be different from the original. Pt has Restoril at home and uses it prn   Irritable bowel syndrome 06/18/2014   Lumbosacral or thoracic radiculopathy  06/18/2014   Menopausal state 06/30/2015   Nonspecific (abnormal) findings on radiological and other examination of gastrointestinal tract 11/28/2012   Overweight 06/18/2014   Palpitations 02/17/2020   Pes anserinus tendinitis or bursitis 06/15/2014   PONV (postoperative nausea and vomiting)    Right flank mass 07/2016   Symptomatic menopausal or female climacteric states 06/18/2014   Tachycardia 01/21/2017   Tear of medial meniscus of left knee 01/24/2014   Vitamin B12 deficiency 06/30/2015    Past Surgical History:  Procedure Laterality Date   ABDOMINAL HYSTERECTOMY     complete   AUGMENTATION MAMMAPLASTY Bilateral 2003   BICEPS TENODESIS Left 06/27/2011   CHOLECYSTECTOMY N/A 11/20/2012   Procedure: LAPAROSCOPIC CHOLECYSTECTOMY possible IOC ;  Surgeon: Harl Bowie, MD;  Location: [place];  Service: General;  Laterality: N/A;   ERCP N/A 11/27/2012   Procedure: ENDOSCOPIC RETROGRADE CHOLANGIOPANCREATOGRAPHY (ERCP);  Surgeon: Beryle Beams, MD;  Location: [facility] ENDOSCOPY;  Service: Endoscopy;  Laterality: N/A;   FOOT NEUROMA SURGERY Right ~ 2008   HIP ARTHROSCOPY Left 08/23/2014   KNEE ARTHROSCOPY Left 03/15/2014   SHOULDER ARTHROSCOPY W/ ROTATOR CUFF REPAIR Right 06/27/2011   WOUND EXPLORATION Right 08/23/2016   Procedure: RIGHT FLANK EXPLORATION;  Surgeon: Coralie Keens, MD;  Location: [facility];  Service: General;  Laterality: Right;    Family History  Problem Relation Age of Onset   Cancer Father        waldenstroms   Cancer  Sister        Theadora Rama   Cancer Paternal Aunt        breast   Breast cancer Paternal Aunt    Cancer Paternal Grandmother        breat   Breast cancer Paternal Grandmother    Breast cancer Cousin     Social History   Tobacco Use   Smoking status: Never   Smokeless tobacco: Never  Substance Use Topics   Alcohol use: Yes    Comment: occasionally   Drug use: No    Prior to Admission medications   Medication Sig Start Date End  Date Taking? Authorizing Provider  Ascorbic Acid (VITAMIN C) 1000 MG tablet Take 1,000 mg by mouth daily.    [provider]  Cyanocobalamin (VITAMIN B 12 PO) Take by mouth.    [provider]  hydrochlorothiazide (HYDRODIURIL) 25 MG tablet Take 12.5 mg by mouth daily.    [provider]  VITAMIN D PO Take by mouth daily.    [provider]    Allergies Meclizine, Adhesive [tape], Erythromycin, Hydrocodone-acetaminophen, and Latex   REVIEW OF SYSTEMS  Negative except as noted here or in the History of Present Illness.   PHYSICAL EXAMINATION  Initial Vital Signs Blood pressure (!) 135/98, pulse 65, temperature 97.7 F (36.5 C), temperature source Oral, resp. rate 20, height '5\' 5"'$  (1.651 m), weight 78.9 kg, SpO2 100 %.  Examination General: Well-developed, well-nourished female in no acute distress; appearance consistent with age of record HENT: normocephalic; atraumatic Eyes: Normal appearance Neck: supple Heart: regular rate and rhythm Lungs: clear to auscultation bilaterally Abdomen: soft; nondistended; nontender; bowel sounds present Extremities: No deformity; full range of motion; pulses normal Neurologic: Awake, alert and oriented; motor function intact in all extremities and symmetric; no facial droop Skin: Warm and dry Psychiatric: Normal mood and affect   RESULTS  Summary of this visit's results, reviewed and interpreted by myself:   EKG Interpretation  Date/Time:    Ventricular Rate:    PR Interval:    QRS Duration:   QT Interval:    QTC Calculation:   R Axis:     Text Interpretation:         Laboratory Studies: Results for orders placed or performed during the hospital encounter of 04/20/22 (from the past 24 hour(s))  CBC with Differential     Status: None   Collection Time: 04/20/22 11:42 PM  Result Value Ref Range   WBC 4.9 4.0 - 10.5 K/uL   RBC 4.59 3.87 - 5.11 MIL/uL   Hemoglobin 13.3 12.0 - 15.0 g/dL   HCT  39.7 36.0 - 46.0 %   MCV 86.5 80.0 - 100.0 fL   MCH 29.0 26.0 - 34.0 pg   MCHC 33.5 30.0 - 36.0 g/dL   RDW 13.4 11.5 - 15.5 %   Platelets 298 150 - 400 K/uL   nRBC 0.0 0.0 - 0.2 %   Neutrophils Relative % 42 %   Neutro Abs 2.0 1.7 - 7.7 K/uL   Lymphocytes Relative 45 %   Lymphs Abs 2.2 0.7 - 4.0 K/uL   Monocytes Relative 8 %   Monocytes Absolute 0.4 0.1 - 1.0 K/uL   Eosinophils Relative 4 %   Eosinophils Absolute 0.2 0.0 - 0.5 K/uL   Basophils Relative 1 %   Basophils Absolute 0.0 0.0 - 0.1 K/uL   Immature Granulocytes 0 %   Abs Immature Granulocytes 0.00 0.00 - 0.07 K/uL  Basic metabolic panel  Status: Abnormal   Collection Time: 04/20/22 11:42 PM  Result Value Ref Range   Sodium 139 135 - 145 mmol/L   Potassium 3.5 3.5 - 5.1 mmol/L   Chloride 108 98 - 111 mmol/L   CO2 24 22 - 32 mmol/L   Glucose, Bld 111 (H) 70 - 99 mg/dL   BUN 18 6 - 20 mg/dL   Creatinine, Ser 0.81 0.44 - 1.00 mg/dL   Calcium 9.0 8.9 - 10.3 mg/dL   GFR, Estimated >60 >60 mL/min   Anion gap 7 5 - 15  D-dimer, quantitative     Status: None   Collection Time: 04/20/22 11:42 PM  Result Value Ref Range   D-Dimer, Quant 0.47 0.00 - 0.50 ug/mL-FEU   Imaging Studies: CT Angio Chest PE W and/or Wo Contrast  Result Date: 04/21/2022 CLINICAL DATA:  Chest wall pain EXAM: CT ANGIOGRAPHY CHEST WITH CONTRAST TECHNIQUE: Multidetector CT imaging of the chest was performed using the standard protocol during bolus administration of intravenous contrast. Multiplanar CT image reconstructions and MIPs were obtained to evaluate the vascular anatomy. RADIATION DOSE REDUCTION: This exam was performed according to the departmental dose-optimization program which includes automated exposure control, adjustment of the mA and/or kV according to patient size and/or use of iterative reconstruction technique. CONTRAST:  66m OMNIPAQUE IOHEXOL 350 MG/ML SOLN COMPARISON:  None Available. FINDINGS: Cardiovascular: Satisfactory  opacification of the bilateral pulmonary arteries to the segmental level. No evidence of pulmonary embolism. Although not tailored for evaluation of the thoracic aorta, there is no evidence thoracic aortic aneurysm or dissection. Heart is normal in size.  No pericardial effusion. Mediastinum/Nodes: No suspicious mediastinal lymphadenopathy. Visualized thyroid is unremarkable. Lungs/Pleura: Visualized lungs are clear. No suspicious pulmonary nodules. No focal consolidation. No pleural effusion or pneumothorax. Upper Abdomen: Visualized upper abdomen is notable for prior cholecystectomy. Musculoskeletal: Bilateral breast augmentation. No associated chest wall abnormality. Visualized osseous structures are within normal limits. Review of the MIP images confirms the above findings. IMPRESSION: No evidence of pulmonary embolism. Negative CT chest. Electronically Signed   By: SJulian HyM.D.   On: 04/21/2022 01:05    ED COURSE and MDM  Nursing notes, initial and subsequent vitals signs, including pulse oximetry, reviewed and interpreted by myself.  Vitals:   04/20/22 2252 04/20/22 2254 04/20/22 2300 04/21/22 0057  BP: (!) 135/98  132/81 109/75  Pulse: 61 65 60 65  Resp: '20 20 18 18  '$ Temp: 97.7 F (36.5 C)   97.7 F (36.5 C)  TempSrc: Oral   Oral  SpO2: 100% 100% 100% 98%  Weight:      Height:       Medications  iohexol (OMNIPAQUE) 350 MG/ML injection 75 mL (75 mLs Intravenous Contrast Given 04/21/22 0043)    12:28 AM We will obtain CT scan of the chest to further elucidate the abnormal finding on chest x-ray.  The patient also could have a chronic pulmonary embolism.  D-dimers are not always abnormal and a subacute pulmonary embolism.  1:17 AM The patient was advised of her reassuring CT scan.  No evidence of infiltrate, tumor or pulmonary embolism.  The cause of her shortness of breath is unclear.  She does believe she has a mold problem in her current house and she was advised to  follow-up with her PCP for mold testing.  She may require a referral to pulmonology as well.  Her lungs are clear and I do not believe an inhaler would be beneficial at this time.  PROCEDURES  Procedures   ED DIAGNOSES     ICD-10-CM   1. Shortness of breath  R06.02          Shanon Rosser, MD 04/21/22 [phone]

## 2022-08-27 ENCOUNTER — Other Ambulatory Visit: Payer: Self-pay | Admitting: General Practice

## 2022-08-27 DIAGNOSIS — N644 Mastodynia: Secondary | ICD-10-CM

## 2022-10-13 ENCOUNTER — Encounter (HOSPITAL_BASED_OUTPATIENT_CLINIC_OR_DEPARTMENT_OTHER): Payer: Self-pay

## 2022-10-13 ENCOUNTER — Emergency Department (HOSPITAL_BASED_OUTPATIENT_CLINIC_OR_DEPARTMENT_OTHER): Payer: BC Managed Care – PPO

## 2022-10-13 ENCOUNTER — Emergency Department (HOSPITAL_BASED_OUTPATIENT_CLINIC_OR_DEPARTMENT_OTHER)
Admission: EM | Admit: 2022-10-13 | Discharge: 2022-10-13 | Disposition: A | Discharge status: Home / Self Care | Payer: BC Managed Care – PPO | Attending: Emergency Medicine | Admitting: Emergency Medicine

## 2022-10-13 ENCOUNTER — Other Ambulatory Visit: Payer: Self-pay

## 2022-10-13 DIAGNOSIS — E039 Hypothyroidism, unspecified: Secondary | ICD-10-CM | POA: Diagnosis not present

## 2022-10-13 DIAGNOSIS — R1031 Right lower quadrant pain: Secondary | ICD-10-CM | POA: Diagnosis present

## 2022-10-13 DIAGNOSIS — Z91040 Latex allergy status: Secondary | ICD-10-CM | POA: Diagnosis not present

## 2022-10-13 LAB — CBC WITH DIFFERENTIAL/PLATELET
Abs Immature Granulocytes: 0.01 10*3/uL (ref 0.00–0.07)
Basophils Absolute: 0 10*3/uL (ref 0.0–0.1)
Basophils Relative: 1 %
Eosinophils Absolute: 0.1 10*3/uL (ref 0.0–0.5)
Eosinophils Relative: 3 %
HCT: 42.2 % (ref 36.0–46.0)
Hemoglobin: 13.8 g/dL (ref 12.0–15.0)
Immature Granulocytes: 0 %
Lymphocytes Relative: 37 %
Lymphs Abs: 1.4 10*3/uL (ref 0.7–4.0)
MCH: 28.3 pg (ref 26.0–34.0)
MCHC: 32.7 g/dL (ref 30.0–36.0)
MCV: 86.5 fL (ref 80.0–100.0)
Monocytes Absolute: 0.3 10*3/uL (ref 0.1–1.0)
Monocytes Relative: 8 %
Neutro Abs: 1.9 10*3/uL (ref 1.7–7.7)
Neutrophils Relative %: 51 %
Platelets: 291 10*3/uL (ref 150–400)
RBC: 4.88 MIL/uL (ref 3.87–5.11)
RDW: 13.5 % (ref 11.5–15.5)
WBC: 3.7 10*3/uL — ABNORMAL LOW (ref 4.0–10.5)
nRBC: 0 % (ref 0.0–0.2)

## 2022-10-13 LAB — URINALYSIS, W/ REFLEX TO CULTURE (INFECTION SUSPECTED)
Bilirubin Urine: NEGATIVE
Glucose, UA: NEGATIVE mg/dL
Ketones, ur: NEGATIVE mg/dL
Nitrite: NEGATIVE
Protein, ur: NEGATIVE mg/dL
Specific Gravity, Urine: 1.02 (ref 1.005–1.030)
pH: 5.5 (ref 5.0–8.0)

## 2022-10-13 LAB — COMPREHENSIVE METABOLIC PANEL
ALT: 29 U/L (ref 0–44)
AST: 25 U/L (ref 15–41)
Albumin: 4.2 g/dL (ref 3.5–5.0)
Alkaline Phosphatase: 73 U/L (ref 38–126)
Anion gap: 6 (ref 5–15)
BUN: 14 mg/dL (ref 6–20)
CO2: 25 mmol/L (ref 22–32)
Calcium: 9 mg/dL (ref 8.9–10.3)
Chloride: 107 mmol/L (ref 98–111)
Creatinine, Ser: 0.77 mg/dL (ref 0.44–1.00)
GFR, Estimated: >60 mL/min (ref >60)
Glucose, Bld: 90 mg/dL (ref 70–99)
Potassium: 3.6 mmol/L (ref 3.5–5.1)
Sodium: 138 mmol/L (ref 135–145)
Total Bilirubin: 0.9 mg/dL (ref 0.3–1.2)
Total Protein: 7.3 g/dL (ref 6.5–8.1)

## 2022-10-13 LAB — LIPASE, BLOOD: Lipase: 39 U/L (ref 11–51)

## 2022-10-13 MED ORDER — MORPHINE SULFATE (PF) 4 MG/ML IV SOLN
4.0000 mg | Freq: Once | INTRAVENOUS | Status: AC
  Administered 2022-10-13: 4 mg via INTRAVENOUS
  Filled 2022-10-13: qty 1

## 2022-10-13 MED ORDER — ONDANSETRON 4 MG PO TBDP: 4.0000 mg | ORAL_TABLET | Freq: Three times a day (TID) | ORAL | 0 refills | Status: AC | PRN

## 2022-10-13 MED ORDER — DICYCLOMINE HCL 10 MG/ML IM SOLN
20.0000 mg | Freq: Once | INTRAMUSCULAR | Status: AC
  Administered 2022-10-13: 20 mg via INTRAMUSCULAR
  Filled 2022-10-13: qty 2

## 2022-10-13 MED ORDER — IOHEXOL 300 MG/ML  SOLN
100.0000 mL | Freq: Once | INTRAMUSCULAR | Status: AC | PRN
  Administered 2022-10-13: 100 mL via INTRAVENOUS

## 2022-10-13 MED ORDER — DICYCLOMINE HCL 20 MG PO TABS
20.0000 mg | ORAL_TABLET | Freq: Two times a day (BID) | ORAL | 0 refills | Status: AC
Start: 2022-10-13 — End: ?

## 2022-10-13 MED ORDER — LACTATED RINGERS IV BOLUS
1000.0000 mL | Freq: Once | INTRAVENOUS | Status: AC
  Administered 2022-10-13: 1000 mL via INTRAVENOUS

## 2022-10-13 MED ORDER — ONDANSETRON HCL 4 MG/2ML IJ SOLN
4.0000 mg | Freq: Once | INTRAMUSCULAR | Status: AC
  Administered 2022-10-13: 4 mg via INTRAVENOUS
  Filled 2022-10-13: qty 2

## 2022-10-13 MED ORDER — CEPHALEXIN 500 MG PO CAPS: 500.0000 mg | ORAL_CAPSULE | Freq: Three times a day (TID) | ORAL | 0 refills | Status: AC

## 2022-10-13 MED ORDER — KETOROLAC TROMETHAMINE 30 MG/ML IJ SOLN
30.0000 mg | Freq: Once | INTRAMUSCULAR | Status: AC
  Administered 2022-10-13: 30 mg via INTRAVENOUS
  Filled 2022-10-13: qty 1

## 2022-10-13 NOTE — ED Notes (Signed)
Pt in bed, pt denies pain or itchiness at injection site, pt states that she has decreased pain and is ready to go home, sig other at bedside, pt verbalized understanding d/c instructions and follow up, pt ambulatory from dpt.

## 2022-10-13 NOTE — ED Triage Notes (Signed)
Pt to er room number two, pt states that she is here for some RLQ pain.  Pt states that the pain started two days ago, states that it has progressively gotten worse. States that the pain is worse with movement.  Reports nausea, and some diarrhea, denies vomiting.

## 2022-10-13 NOTE — Discharge Instructions (Addendum)
It was a pleasure caring for you today! Your CT shows no acute abnormalities. It is possible your increasing abdominal pain, nausea, decreased appetite may be secondary to your mounjaro dose increasing, other infection. Your urine shows bacteria but no other signs of infection--we will treat you for possible infection and also give you medicine for abdominal cramping and nausea.  You may need to see your gastroenterologist if symptoms do not improve.

## 2022-10-13 NOTE — ED Provider Notes (Signed)
[facility] [facility] Provider Note   CSN: [id number] Arrival date & time: 10/13/22  [id number]     History  Chief Complaint  Patient presents with   Abdominal Pain    Jessica Robinson is a 52 y.o. female.  HPI     52 year old female with a history of hypothyroidism hysterectomy, cholecystectomy, anxiety, ADD, migraine, who presents with concern for right-sided abdominal pain.   Reports right-sided lower abdominal pain that began 2 days ago.  Is been progressively worsening.  It is worse with movement and palpation.  She has had some nausea, decreased appetite.  Denies vomiting.  Yesterday had 3 episodes of diarrhea.  Reports she does have some history of colitis in the past but typically pain is on the left side and has not had right-sided abdominal pain like this before.  Reports she had a total hysterectomy including oophorectomy.  Denies dysuria.  Reports she has some hot flashes at baseline is unknown known if she has had fever.  She is on [facility] for prediabetes and thinks this is may be affecting her appetite.  She did have the dose increased 2 weeks ago.   Past Medical History:  Diagnosis Date   Abdominal pain, right upper quadrant 11/28/2012   ADD (attention deficit disorder) 05/06/2016   Adjustment disorder with anxiety 01/04/2014   Last Assessment & Plan:  Formatting of this note might be different from the original. Psychological condition is worsening. Regular aerobic exercise. Psychological condition  will be reassessed in 4 weeks. Start Zoloft   Anxiety disorder 06/18/2014   Arthritis    back   Bursitis of left hip 06/15/2014   Cardiac murmur 02/17/2020   Chronic cholecystitis 10/27/2012   Chronic lower back pain 06/18/2014   Chronic tension-type headache [id number]   Complication of anesthesia    states heart rate always drops; has resting heart rate in 60s   Diverticulosis 06/18/2014   Generalized abdominal pain 06/26/2016   Heart  palpitations 01/21/2017   History of migraine    Hyperthyroidism 02/17/2020   Insomnia 01/04/2014   Last Assessment & Plan:  Formatting of this note might be different from the original. Pt has Restoril at home and uses it prn   Irritable bowel syndrome 06/18/2014   Lumbosacral or thoracic radiculopathy 06/18/2014   Menopausal state 06/30/2015   Nonspecific (abnormal) findings on radiological and other examination of gastrointestinal tract 11/28/2012   Overweight 06/18/2014   Palpitations 02/17/2020   Pes anserinus tendinitis or bursitis 06/15/2014   PONV (postoperative nausea and vomiting)    Right flank mass 07/2016   Symptomatic menopausal or female climacteric states 06/18/2014   Tachycardia 01/21/2017   Tear of medial meniscus of left knee 01/24/2014   Vitamin B12 deficiency 06/30/2015    Past Surgical History:  Procedure Laterality Date   ABDOMINAL HYSTERECTOMY     complete   AUGMENTATION MAMMAPLASTY Bilateral 2003   BICEPS TENODESIS Left 06/27/2011   CHOLECYSTECTOMY N/A 11/20/2012   Procedure: LAPAROSCOPIC CHOLECYSTECTOMY possible IOC ;  Surgeon: Harl Bowie, MD;  Location: [place];  Service: General;  Laterality: N/A;   ERCP N/A 11/27/2012   Procedure: ENDOSCOPIC RETROGRADE CHOLANGIOPANCREATOGRAPHY (ERCP);  Surgeon: Beryle Beams, MD;  Location: [facility] ENDOSCOPY;  Service: Endoscopy;  Laterality: N/A;   FOOT NEUROMA SURGERY Right ~ 2008   HIP ARTHROSCOPY Left 08/23/2014   KNEE ARTHROSCOPY Left 03/15/2014   SHOULDER ARTHROSCOPY W/ ROTATOR CUFF REPAIR Right 06/27/2011   WOUND EXPLORATION Right 08/23/2016  Procedure: RIGHT FLANK EXPLORATION;  Surgeon: Coralie Keens, MD;  Location: [place];  Service: General;  Laterality: Right;    Home Medications Prior to Admission medications   Medication Sig Start Date End Date Taking? Authorizing Provider  Ascorbic Acid (VITAMIN C) 1000 MG tablet Take 1,000 mg by mouth daily.    [provider]  cephALEXin (KEFLEX)  500 MG capsule Take 1 capsule (500 mg total) by mouth 3 (three) times daily for 7 days. 10/13/22 10/20/22 Yes Gareth Morgan, MD  Cyanocobalamin (VITAMIN B 12 PO) Take by mouth.    [provider]  dicyclomine (BENTYL) 20 MG tablet Take 1 tablet (20 mg total) by mouth 2 (two) times daily. 10/13/22  Yes Gareth Morgan, MD  hydrochlorothiazide (HYDRODIURIL) 25 MG tablet Take 12.5 mg by mouth daily.    [provider]  ondansetron (ZOFRAN-ODT) 4 MG disintegrating tablet Take 1 tablet (4 mg total) by mouth every 8 (eight) hours as needed for nausea or vomiting. 10/13/22  Yes Gareth Morgan, MD  VITAMIN D PO Take by mouth daily.    [provider]      Allergies    Meclizine, Adhesive [tape], Erythromycin, Hydrocodone-acetaminophen, and Latex    Review of Systems   Review of Systems  Physical Exam Updated Vital Signs BP 115/80 (BP Location: Right Arm)   Pulse 73   Temp 98.3 F (36.8 C) (Oral)   Resp 17   Ht 5' 4"$  (1.626 m)   Wt 79.8 kg   SpO2 100%   BMI 30.21 kg/m  Physical Exam Vitals and nursing note reviewed.  Constitutional:      General: She is not in acute distress.    Appearance: Normal appearance. She is not ill-appearing, toxic-appearing or diaphoretic.  HENT:     Head: Normocephalic.  Eyes:     Conjunctiva/sclera: Conjunctivae normal.  Cardiovascular:     Rate and Rhythm: Normal rate and regular rhythm.     Pulses: Normal pulses.  Pulmonary:     Effort: Pulmonary effort is normal. No respiratory distress.  Abdominal:     General: There is no distension.     Tenderness: There is abdominal tenderness in the right lower quadrant. There is guarding.  Musculoskeletal:        General: No deformity or signs of injury.     Cervical back: No rigidity.  Skin:    General: Skin is warm and dry.     Coloration: Skin is not jaundiced or pale.  Neurological:     General: No focal deficit present.     Mental Status: She is alert and oriented to  person, place, and time.     ED Results / Procedures / Treatments   Labs (all labs ordered are listed, but only abnormal results are displayed) Labs Reviewed  CBC WITH DIFFERENTIAL/PLATELET - Abnormal; Notable for the following components:      Result Value   WBC 3.7 (*)    All other components within normal limits  URINALYSIS, W/ REFLEX TO CULTURE (INFECTION SUSPECTED) - Abnormal; Notable for the following components:   Hgb urine dipstick TRACE (*)    Leukocytes,Ua TRACE (*)    Bacteria, UA MANY (*)    All other components within normal limits  COMPREHENSIVE METABOLIC PANEL  LIPASE, BLOOD    EKG None  Radiology CT ABDOMEN PELVIS W CONTRAST  Result Date: 10/13/2022 CLINICAL DATA:  RLQ pain EXAM: CT ABDOMEN AND PELVIS WITH CONTRAST TECHNIQUE: Multidetector CT imaging of the abdomen  and pelvis was performed using the standard protocol following bolus administration of intravenous contrast. RADIATION DOSE REDUCTION: This exam was performed according to the departmental dose-optimization program which includes automated exposure control, adjustment of the mA and/or kV according to patient size and/or use of iterative reconstruction technique. CONTRAST:  100 mL OMNIPAQUE IOHEXOL 300 MG/ML  SOLN COMPARISON:  04/01/2019 FINDINGS: Lower chest: Lung bases clear.  No pleural or pericardial effusion. Hepatobiliary: No focal liver abnormality is seen. Status post cholecystectomy. No biliary dilatation. Pancreas: Unremarkable. No pancreatic ductal dilatation or surrounding inflammatory changes. Spleen: Normal in size without focal abnormality. Adrenals/Urinary Tract: Adrenal glands are unremarkable. Kidneys are normal, without renal calculi, focal lesion, or hydronephrosis. Bladder is unremarkable. Stomach/Bowel: Stomach is within normal limits. Appendix appears normal. No evidence of bowel wall thickening, distention, or inflammatory changes. Diverticulosis noted of the sigmoid. No evidence of  diverticulitis. Vascular/Lymphatic: No significant vascular findings are present. No enlarged abdominal or pelvic lymph nodes. Reproductive: Status post hysterectomy. No adnexal masses. Other: No abdominal wall hernia or abnormality. No abdominopelvic ascites. Musculoskeletal: No acute or significant osseous findings. IMPRESSION: No acute abdominal or pelvic pathology. Electronically Signed   By: Sammie Bench M.D.   On: 10/13/2022 10:09    Procedures Procedures    Medications Ordered in ED Medications  ketorolac (TORADOL) 30 MG/ML injection 30 mg (has no administration in time range)  dicyclomine (BENTYL) injection 20 mg (has no administration in time range)  lactated ringers bolus 1,000 mL (1,000 mLs Intravenous New Bag/Given 10/13/22 0934)  ondansetron (ZOFRAN) injection 4 mg (4 mg Intravenous Given 10/13/22 0930)  iohexol (OMNIPAQUE) 300 MG/ML solution 100 mL (100 mLs Intravenous Contrast Given 10/13/22 0945)  morphine (PF) 4 MG/ML injection 4 mg (4 mg Intravenous Given 10/13/22 1017)    ED Course/ Medical Decision Making/ A&P                              52 year old female with a history of hypothyroidism hysterectomy, cholecystectomy, anxiety, ADD, migraine, who presents with concern for right-sided abdominal pain.  DDx includes appendicitis, pancreatitis, choledocolithiasis, pyelonephritis, nephrolithiasis, diverticulitis, colitis, gastroenteritis, SBO, mesenteric ischemia, effects of mounjaro with dose increase 2 weeks ago.  Labs obtained and personally about interpreted by me show a white blood cell count of 3.7, transaminases WNL, lipase WNLnot consistent with pancreatitis.  Doubt cholangitis/choledocolithiasis. Urinalysis shows many bacteria without other sign of infection, leukocytes 0-5.  Given IV fluids and zofran, morphine.   CT abdomen pelvis ordered to evaluate for signs of appendicitis and evaluated by me and radiology shows no acute abdominal or pelvic pathology. Called  to discuss this with Dr. Celesta Gentile Radiology.  NO adnexal abnormalities, doubt torsion.  Suspect symptoms of increasing abdominal pain, nausea and decreased appetite may be secondary to [facility] increase.  Recommend discussion with her doctor for decreased dose.  Do not see other indication for emergent surgery or admission at this time.  Given bacteria on UA, will treat for urinary tract infection with Keflex.  Given prescription for Zofran, Bentyl, Keflex and recommend continued PCP follow-up and possible GI follow up if it continues.         Final Clinical Impression(s) / ED Diagnoses Final diagnoses:  Right lower quadrant abdominal pain    Rx / DC Orders ED Discharge Orders          Ordered    dicyclomine (BENTYL) 20 MG tablet  2 times daily  10/13/22 1050    ondansetron (ZOFRAN-ODT) 4 MG disintegrating tablet  Every 8 hours PRN        10/13/22 1050    cephALEXin (KEFLEX) 500 MG capsule  3 times daily        10/13/22 1050              Gareth Morgan, MD 10/13/22 1054

## 2022-10-14 ENCOUNTER — Ambulatory Visit
Admission: RE | Admit: 2022-10-14 | Discharge: 2022-10-14 | Disposition: A | Discharge status: Home / Self Care | Payer: BC Managed Care – PPO | Source: Ambulatory Visit | Attending: General Practice | Admitting: General Practice

## 2022-10-14 DIAGNOSIS — N644 Mastodynia: Secondary | ICD-10-CM

## 2022-12-16 ENCOUNTER — Other Ambulatory Visit: Payer: Self-pay | Admitting: Oral Surgery

## 2022-12-16 DIAGNOSIS — M26639 Articular disc disorder of temporomandibular joint, unspecified side: Secondary | ICD-10-CM

## 2022-12-16 DIAGNOSIS — M2652 Limited mandibular range of motion: Secondary | ICD-10-CM

## 2022-12-16 NOTE — Progress Notes (Signed)
  December 16, 2022  Jessica Robinson, Jessica Robinson  is a 52 yo  Self Referral for TMJ evaluation.   CC: Left TMJ pain for 8 months. Hurts to open and close mouth. Clicking and popping, crepitus.   HPI:No h/o injury, trauma. One episode of locking last week, none prior to that.    Past Medical History: Pain and Clicking of Jaw  Medications: None   Allergies: Latex  Exam:  No  clicking or popping of the TMJ. The maximum opening was  33 mm, with decreased range of motion, pain on right and left lateral motion. The occlusion was Class I. The muscles of mastication were non-tender to palpation. There was no crepitus present. Mallampati 1. Oral cancer screening negative. Pharynx clear. no lymphadenopathy  Pan: Normal right and left TMJ joint anatomy  Assessment: ASA  1. Bilateral TMJ disc dislocation.   Plan: Discussed medications, TMJ splint, MRI. Recommend conservative treatment of NSAIDs, and TMJ splint. Ice, heat, and soft diet.   Rx: Mobic 15 mg 30 tabs 1 qd 1 refill.  Georgia Lopes, DMD    [phone] Limited mandibular range of motion M26.53 Deviation in opening and closing of the mandible M26.62  Left temporomandibular joint disorder, unspecified

## 2022-12-25 ENCOUNTER — Ambulatory Visit (HOSPITAL_COMMUNITY): Payer: BC Managed Care – PPO

## 2022-12-26 ENCOUNTER — Ambulatory Visit (HOSPITAL_COMMUNITY): Payer: BC Managed Care – PPO

## 2023-01-07 ENCOUNTER — Ambulatory Visit (HOSPITAL_COMMUNITY)
Admission: RE | Admit: 2023-01-07 | Discharge: 2023-01-07 | Disposition: A | Discharge status: Home / Self Care | Payer: BC Managed Care – PPO | Source: Ambulatory Visit | Attending: Oral Surgery | Admitting: Oral Surgery

## 2023-01-07 DIAGNOSIS — M2652 Limited mandibular range of motion: Secondary | ICD-10-CM | POA: Insufficient documentation

## 2023-01-07 DIAGNOSIS — M26639 Articular disc disorder of temporomandibular joint, unspecified side: Secondary | ICD-10-CM | POA: Insufficient documentation

## 2023-01-23 ENCOUNTER — Other Ambulatory Visit: Payer: Self-pay | Admitting: Oral Surgery

## 2023-01-23 DIAGNOSIS — Q385 Congenital malformations of palate, not elsewhere classified: Secondary | ICD-10-CM

## 2023-01-23 DIAGNOSIS — M278 Other specified diseases of jaws: Secondary | ICD-10-CM

## 2023-02-07 ENCOUNTER — Ambulatory Visit
Admission: RE | Admit: 2023-02-07 | Discharge: 2023-02-07 | Disposition: A | Discharge status: Home / Self Care | Payer: BC Managed Care – PPO | Source: Ambulatory Visit | Attending: Oral Surgery | Admitting: Oral Surgery

## 2023-03-03 ENCOUNTER — Other Ambulatory Visit: Payer: BC Managed Care – PPO

## 2023-11-17 ENCOUNTER — Other Ambulatory Visit: Payer: Self-pay | Admitting: General Practice

## 2023-11-17 DIAGNOSIS — Z1231 Encounter for screening mammogram for malignant neoplasm of breast: Secondary | ICD-10-CM

## 2023-11-20 ENCOUNTER — Ambulatory Visit
Admission: RE | Admit: 2023-11-20 | Discharge: 2023-11-20 | Disposition: A | Discharge status: Home / Self Care | Source: Ambulatory Visit | Attending: General Practice | Admitting: General Practice

## 2023-11-20 DIAGNOSIS — Z1231 Encounter for screening mammogram for malignant neoplasm of breast: Secondary | ICD-10-CM
